# Patient Record
Sex: Female | Born: 1965 | Race: Black or African American | Hispanic: No | Marital: Single | Smoking: Current every day smoker
Health system: Southern US, Community
[De-identification: ages and names within clinical notes are randomized; demographics above are authoritative.]

## PROBLEM LIST (undated history)

## (undated) HISTORY — PX: CHOLECYSTECTOMY: SHX55

## (undated) HISTORY — PX: TUBAL LIGATION: SHX77

## (undated) HISTORY — PX: HERNIA REPAIR: SHX51

---

## 1997-12-31 ENCOUNTER — Ambulatory Visit (HOSPITAL_BASED_OUTPATIENT_CLINIC_OR_DEPARTMENT_OTHER): Admission: RE | Admit: 1997-12-31 | Discharge: 1997-12-31 | Payer: Self-pay | Admitting: General Surgery

## 2008-09-07 ENCOUNTER — Emergency Department (HOSPITAL_COMMUNITY): Admission: EM | Admit: 2008-09-07 | Discharge: 2008-09-07 | Payer: Self-pay | Admitting: Family Medicine

## 2010-09-10 LAB — POCT URINALYSIS DIP (DEVICE)
Bilirubin Urine: NEGATIVE
Glucose, UA: NEGATIVE mg/dL
Ketones, ur: NEGATIVE mg/dL
pH: 5.5 (ref 5.0–8.0)

## 2014-09-28 ENCOUNTER — Ambulatory Visit: Payer: BLUE CROSS/BLUE SHIELD | Attending: Gynecologic Oncology | Admitting: Gynecologic Oncology

## 2014-09-28 ENCOUNTER — Encounter: Payer: Self-pay | Admitting: Gynecologic Oncology

## 2014-09-28 VITALS — BP 138/92 | HR 104 | Temp 98.4°F | Resp 20 | Ht 61.0 in | Wt 235.6 lb

## 2014-09-28 DIAGNOSIS — N8320 Unspecified ovarian cysts: Secondary | ICD-10-CM

## 2014-09-28 DIAGNOSIS — N83201 Unspecified ovarian cyst, right side: Secondary | ICD-10-CM | POA: Insufficient documentation

## 2014-09-28 DIAGNOSIS — Z6841 Body Mass Index (BMI) 40.0 and over, adult: Secondary | ICD-10-CM | POA: Diagnosis not present

## 2014-09-28 NOTE — Progress Notes (Signed)
Consult Note: Gyn-Onc  Consult was requested by Dr. Radene Knee for the evaluation of Katherine Bean 49 y.o. female  CC:  Chief Complaint  Patient presents with  . Right ovarian benign tumor    Assessment/Plan:  Katherine Bean  is a 49 y.o.  year old morbidly obese woman with a right ovarian mass. This mass is most likely benign, an endometrioma, however given its increase in size (2 cm growth in 4 weeks) and a complex features on imaging, I am recommending surgical excision for definitive pathologic evaluation. I am reassured by the normal CA-125, her young age, and asymptomatic nature of this mass.I discussed the planned operative approach which is robotic-assisted right salpingo-oophorectomy, possible hysterectomy and staging if malignancy is identified. I discussed surgical risks including  bleeding, infection, damage to internal organs (such as bladder,ureters, bowels), blood clot, reoperation and rehospitalization. I discussed that she is at an increased risk for these given her morbid obesity. I offered her continued surveillance for an additional 6 weeks if she would prefer to avoid surgical intervention. Patient is anxious regarding the underlying pathology and would prefer for surgical excision.   HPI: Katherine Bean is a very pleasant 49 year old gravida 3 para 3 who is seen in consultation at the request of Dr. 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 The patient is history began in March 2016 at which time she saw her OB/GYN for routine annual care. She is a long-standing history of irregular menses, however had more recently noted an increased thousand on discharge at the end of her period. This prompted Dr. Radene Knee to  order a transvaginal ultrasound scan which was performed on March 30 second 2016. It showed a normal uterus with a 4 mm endometrial stripe, a normal left ovary, and a right ovary with a hemorrhagic 3.5 x 3.1 cm cystic mass with septations and  loculations and no flow seen to the cysts. The plan was to follow up ultrasound scan in 4 weeks' time and this was performed on 09/18/2014. At this time the right ovary now measured 5.5 x 3.2 x 3.8 cm, 1 CO2 multicystic complex mass with no blood flow seen within the mass. There was no free fluid within the cul-de-sac.A CA-125 was drawn on 09/18/2014 and was normal at 13 units per milliliter.  The patient reports no symptoms concerning for ovarian cancer including no bloating, early satiety, pelvic pain, change in bladder or bowel habit. She is otherwise fairly healthy with the exception of morbid obesity. Her only abdominal surgeries are laparoscopic cholecystectomy and tubal ligation. She is no concerning family history for a genetic predisposition for breast or ovarian cancer.  Current Meds:  Outpatient Encounter Prescriptions as of 09/28/2014  Medication Sig  . clotrimazole-betamethasone (LOTRISONE) cream   . metroNIDAZOLE (METROGEL) 0.75 % vaginal gel     Allergy: No Known Allergies  Social Hx:   History   Social History  . Marital Status: Single    Spouse Name: N/A  . Number of Children: N/A  . Years of Education: N/A   Occupational History  . Not on file.   Social History Main Topics  . Smoking status: Current Every Day Smoker    Types: Cigarettes  . Smokeless tobacco: Not on file  . Alcohol Use: 0.6 oz/week    1 Glasses of wine per week  . Drug Use: No  . Sexual Activity: Yes   Other Topics Concern  . Not on  file   Social History Narrative  . No narrative on file    Past Surgical Hx: History reviewed. No pertinent past surgical history.  Past Medical Hx: History reviewed. No pertinent past medical history.  Past Gynecological History:  SVD x 3, no hx of abnormal paps.  No LMP recorded.  Family Hx: History reviewed. No pertinent family history.  Review of Systems:  Constitutional  Feels well,    ENT Normal appearing ears and nares bilaterally Skin/Breast   No rash, sores, jaundice, itching, dryness Cardiovascular  No chest pain, shortness of breath, or edema  Pulmonary  No cough or wheeze.  Gastro Intestinal  No nausea, vomitting, or diarrhoea. No bright red blood per rectum, no abdominal pain, change in bowel movement, or constipation.  Genito Urinary  No frequency, urgency, dysuria, see HPI Musculo Skeletal  No myalgia, arthralgia, joint swelling or pain  Neurologic  No weakness, numbness, change in gait,  Psychology  No depression, anxiety, insomnia.   Vitals:  Blood pressure 138/92, pulse 104, temperature 98.4 F (36.9 C), temperature source Oral, resp. rate 20, height 5\' 1"  (1.549 m), weight 235 lb 9.6 oz (106.867 kg).  Physical Exam: WD in NAD Neck  Supple NROM, without any enlargements.  Lymph Node Survey No cervical supraclavicular or inguinal adenopathy Cardiovascular  Pulse normal rate, regularity and rhythm. S1 and S2 normal.  Lungs  Clear to auscultation bilateraly, without wheezes/crackles/rhonchi. Good air movement.  Skin  No rash/lesions/breakdown  Psychiatry  Alert and oriented to person, place, and time  Abdomen  Normoactive bowel sounds, abdomen soft, non-tender and obese without evidence of hernia.  Back No CVA tenderness Genito Urinary  Vulva/vagina: Normal external female genitalia.   No lesions. No discharge or bleeding.  Bladder/urethra:  No lesions or masses, well supported bladder  Vagina: no lesions  Cervix: Normal appearing, no lesions.  Uterus:  Small, mobile, no parametrial involvement or nodularity.  Adnexa: no palpable masses. Rectal  Good tone, no masses no cul de sac nodularity.  Extremities  No bilateral cyanosis, clubbing or edema.   Donaciano Eva, MD   09/28/2014, 1:33 PM

## 2014-09-28 NOTE — Patient Instructions (Signed)
Preparing for your Surgery  Plan for surgery on Jun 14 with Dr. Denman George  Pre-operative Testing -You will receive a phone call from presurgical testing at [facility] to arrange for a pre-operative testing appointment before your surgery.  This appointment normally occurs one to two weeks before your scheduled surgery.   -Bring your insurance card, copy of an advanced directive if applicable, medication list  -At that visit, you will be asked to sign a consent for a possible blood transfusion in case a transfusion becomes necessary during surgery.  The need for a blood transfusion is rare but having consent is a necessary part of your care.     -You should not be taking blood thinners or aspirin at least ten days prior to surgery unless instructed by your surgeon.  Day Before Surgery at [facility] will be asked to take in only clear liquids the day before surgery.  Examples of clear liquids include broths, jello, and clear juices.  You will be advised to have nothing to eat or drink after midnight the evening before.    Your role in recovery Your role is to become active as soon as directed by your doctor, while still giving yourself time to heal.  Rest when you feel tired. You will be asked to do the following in order to speed your recovery:  - Cough and breathe deeply. This helps toclear and expand your lungs and can prevent pneumonia. You may be given a spirometer to practice deep breathing. A staff member will show you how to use the spirometer. - Do mild physical activity. Walking or moving your legs help your circulation and body functions return to normal. A staff member will help you when you try to walk and will provide you with simple exercises. Do not try to get up or walk alone the first time. - Actively manage your pain. Managing your pain lets you move in comfort. We will ask you to rate your pain on a scale of zero to 10. It is your responsibility to tell your  doctor or nurse where and how much you hurt so your pain can be treated.  Special Considerations -If you are diabetic, you may be placed on insulin after surgery to have closer control over your blood sugars to promote healing and recovery.  This does not mean that you will be discharged on insulin.  If applicable, your oral antidiabetics will be resumed when you are tolerating a solid diet.  -Your final pathology results from surgery should be available by the Friday after surgery and the results will be relayed to you when available.                Preparing for your Surgery  Plan for surgery on Jun 14  with Dr. Dr. Denman George  Pre-operative Testing -You will receive a phone call from presurgical testing at [facility]  before your surgery.   -You should not be taking blood thinners or aspirin at least ten days prior to surgery unless instructed by your surgeon.  Day Before Surgery at [facility] will be asked to take in only clear liquids the day before surgery.  Examples of clear liquids include broths, jello, and clear juices.   You will be advised to have nothing to eat or drink after midnight the evening before.    Your role in recovery Your role is to become active as soon as directed by your doctor, while still giving yourself time to  heal.  Rest when you feel tired. You will be asked to do the following in order to speed your recovery:  - Cough and breathe deeply. This helps toclear and expand your lungs and can prevent pneumonia. You may be given a spirometer to practice deep breathing. A staff member will show you how to use the spirometer. - Do mild physical activity. Walking or moving your legs help your circulation and body functions return to normal. A staff member will help you when you try to walk and will provide you with simple exercises. Do not try to get up or walk alone the first time. - Actively manage your pain. Managing your pain lets you move in comfort. We will  ask you to rate your pain on a scale of zero to 10. It is your responsibility to tell your doctor or nurse where and how much you hurt so your pain can be treated.  Special Considerations -If you are diabetic, you may be placed on insulin after surgery to have closer control over your blood sugars to promote healing and recovery.  This does not mean that you will be discharged on insulin.  If applicable, your oral antidiabetics will be resumed when you are tolerating a solid diet.  -Your final pathology results from surgery should be available by the Friday after surgery and the results will be relayed to you when available.

## 2014-11-09 NOTE — Patient Instructions (Addendum)
Katherine Bean  11/09/2014   Your procedure is scheduled on:   11-13-2014 Tuesday  Enter through [facility]  Entrance and follow signs to [facility] Arrive at      0730 AM.  (Limit 1 person with you).  Call this number if you have problems the morning of surgery: [phone]  Or Presurgical Testing [phone].   For Living Will and/or Health Care Power Attorney Forms: please provide copy for your medical record,may bring AM of surgery(Forms should be already notarized -we do not provide this service).(11-12-14  No information preferred today).  Remember: Follow any bowel prep instructions per MD office.(Clear Liquids only day before surgery). For Cpap use: Bring mask and tubing only.   Do not eat food/ or drink: After Midnight.    Clear liquids include soda, tea, black coffee, apple or grape juice, broth.  Take these medicines the morning of surgery with A SIP OF WATER: NONE.   Do not wear jewelry, make-up or nail polish.  Do not wear deodorant, lotions, powders, or perfumes.   Do not shave legs and under arms- 48 hours(2 days) prior to first CHG shower.(Shaving face and neck okay.)  Do not bring valuables to the hospital.(Hospital is not responsible for lost valuables).  Contacts, dentures or removable bridgework, body piercing, hair pins may not be worn into surgery.  Leave suitcase in the car. After surgery it may be brought to your room.  For patients admitted to the hospital, checkout time is 11:00 AM the day of discharge.(Restricted visitors-Any Persons displaying flu-like symptoms or illness).    Patients discharged the day of surgery will not be allowed to drive home. Must have responsible person with you x 24 hours once discharged.  Name and phone number of your driver: Patrick North, daughter [phone] cell     Please read over the following fact sheets that you were given:  CHG(Chlorhexidine Gluconate 4% Surgical Soap) use,  Blood Transfusion fact sheet,  Incentive Spirometry Instruction.  Remember : Type/Screen "Blue armbands" - may not be removed once applied(would result in being retested AM of surgery, if removed).         [facility] - Preparing for Surgery Before surgery, you can play an important role.  Because skin is not sterile, your skin needs to be as free of germs as possible.  You can reduce the number of germs on your skin by washing with CHG (chlorahexidine gluconate) soap before surgery.  CHG is an antiseptic cleaner which kills germs and bonds with the skin to continue killing germs even after washing. Please DO NOT use if you have an allergy to CHG or antibacterial soaps.  If your skin becomes reddened/irritated stop using the CHG and inform your nurse when you arrive at Short Stay. Do not shave (including legs and underarms) for at least 48 hours prior to the first CHG shower.  You may shave your face/neck. Please follow these instructions carefully:  1.  Shower with CHG Soap the night before surgery and the  morning of Surgery.  2.  If you choose to wash your hair, wash your hair first as usual with your  normal  shampoo.  3.  After you shampoo, rinse your hair and body thoroughly to remove the  shampoo.                           4.  Use CHG as you would any other liquid  soap.  You can apply chg directly  to the skin and wash                       Gently with a scrungie or clean washcloth.  5.  Apply the CHG Soap to your body ONLY FROM THE NECK DOWN.   Do not use on face/ open                           Wound or open sores. Avoid contact with eyes, ears mouth and genitals (private parts).                       Wash face,  Genitals (private parts) with your normal soap.             6.  Wash thoroughly, paying special attention to the area where your surgery  will be performed.  7.  Thoroughly rinse your body with warm water from the neck down.  8.  DO NOT shower/wash with your normal soap after using and rinsing off  the CHG  Soap.                9.  Pat yourself dry with a clean towel.            10.  Wear clean pajamas.            11.  Place clean sheets on your bed the night of your first shower and do not  sleep with pets. Day of Surgery : Do not apply any lotions/deodorants the morning of surgery.  Please wear clean clothes to the hospital/surgery center.  FAILURE TO FOLLOW THESE INSTRUCTIONS MAY RESULT IN THE CANCELLATION OF YOUR SURGERY PATIENT SIGNATURE_________________________________  NURSE SIGNATURE__________________________________  ________________________________________________________________________   [name]  An incentive spirometer is a tool that can help keep your lungs clear and active. This tool measures how well you are filling your lungs with each breath. Taking long deep breaths may help reverse or decrease the chance of developing breathing (pulmonary) problems (especially infection) following:  A long period of time when you are unable to move or be active. BEFORE THE PROCEDURE   If the spirometer includes an indicator to show your best effort, your nurse or respiratory therapist will set it to a desired goal.  If possible, sit up straight or lean slightly forward. Try not to slouch.  Hold the incentive spirometer in an upright position. INSTRUCTIONS FOR USE   Sit on the edge of your bed if possible, or sit up as far as you can in bed or on a chair.  Hold the incentive spirometer in an upright position.  Breathe out normally.  Place the mouthpiece in your mouth and seal your lips tightly around it.  Breathe in slowly and as deeply as possible, raising the piston or the ball toward the top of the column.  Hold your breath for 3-5 seconds or for as long as possible. Allow the piston or ball to fall to the bottom of the column.  Remove the mouthpiece from your mouth and breathe out normally.  Rest for a few seconds and repeat Steps 1 through 7 at least 10  times every 1-2 hours when you are awake. Take your time and take a few normal breaths between deep breaths.  The spirometer may include an indicator to show your best effort. Use the indicator as a goal  to work toward during each repetition.  After each set of 10 deep breaths, practice coughing to be sure your lungs are clear. If you have an incision (the cut made at the time of surgery), support your incision when coughing by placing a pillow or rolled up towels firmly against it. Once you are able to get out of bed, walk around indoors and cough well. You may stop using the incentive spirometer when instructed by your caregiver.  RISKS AND COMPLICATIONS  Take your time so you do not get dizzy or light-headed.  If you are in pain, you may need to take or ask for pain medication before doing incentive spirometry. It is harder to take a deep breath if you are having pain. AFTER USE  Rest and breathe slowly and easily.  It can be helpful to keep track of a log of your progress. Your caregiver can provide you with a simple table to help with this. If you are using the spirometer at home, follow these instructions: [place] IF:   You are having difficultly using the spirometer.  You have trouble using the spirometer as often as instructed.  Your pain medication is not giving enough relief while using the spirometer.  You develop fever of 100.5 F (38.1 C) or higher. SEEK IMMEDIATE MEDICAL CARE IF:   You cough up bloody sputum that had not been present before.  You develop fever of 102 F (38.9 C) or greater.  You develop worsening pain at or near the incision site. MAKE SURE YOU:   Understand these instructions.  Will watch your condition.  Will get help right away if you are not doing well or get worse. Document Released: 09/28/2006 Document Revised: 08/10/2011 Document Reviewed: 11/29/2006 ExitCare Patient Information 2014 Exit[employer],  [place].   ________________________________________________________________________  WHAT IS A BLOOD TRANSFUSION? Blood Transfusion Information  A transfusion is the replacement of blood or some of its parts. Blood is made up of multiple cells which provide different functions.  Red blood cells carry oxygen and are used for blood loss replacement.  White blood cells fight against infection.  Platelets control bleeding.  Plasma helps clot blood.  Other blood products are available for specialized needs, such as hemophilia or other clotting disorders. BEFORE THE TRANSFUSION  Who gives blood for transfusions?   Healthy volunteers who are fully evaluated to make sure their blood is safe. This is blood bank blood. Transfusion therapy is the safest it has ever been in the practice of medicine. Before blood is taken from a donor, a complete history is taken to make sure that person has no history of diseases nor engages in risky social behavior (examples are intravenous drug use or sexual activity with multiple partners). The donor's travel history is screened to minimize risk of transmitting infections, such as malaria. The donated blood is tested for signs of infectious diseases, such as HIV and hepatitis. The blood is then tested to be sure it is compatible with you in order to minimize the chance of a transfusion reaction. If you or a relative donates blood, this is often done in anticipation of surgery and is not appropriate for emergency situations. It takes many days to process the donated blood. RISKS AND COMPLICATIONS Although transfusion therapy is very safe and saves many lives, the main dangers of transfusion include:   Getting an infectious disease.  Developing a transfusion reaction. This is an allergic reaction to something in the blood you were given. Every precaution is  taken to prevent this. The decision to have a blood transfusion has been considered carefully by your caregiver  before blood is given. Blood is not given unless the benefits outweigh the risks. AFTER THE TRANSFUSION  Right after receiving a blood transfusion, you will usually feel much better and more energetic. This is especially true if your red blood cells have gotten low (anemic). The transfusion raises the level of the red blood cells which carry oxygen, and this usually causes an energy increase.  The nurse administering the transfusion will monitor you carefully for complications. HOME CARE INSTRUCTIONS  No special instructions are needed after a transfusion. You may find your energy is better. Speak with your caregiver about any limitations on activity for underlying diseases you may have. SEEK MEDICAL CARE IF:   Your condition is not improving after your transfusion.  You develop redness or irritation at the intravenous (IV) site. SEEK IMMEDIATE MEDICAL CARE IF:  Any of the following symptoms occur over the next 12 hours:  Shaking chills.  You have a temperature by mouth above 102 F (38.9 C), not controlled by medicine.  Chest, back, or muscle pain.  People around you feel you are not acting correctly or are confused.  Shortness of breath or difficulty breathing.  Dizziness and fainting.  You get a rash or develop hives.  You have a decrease in urine output.  Your urine turns a dark color or changes to pink, red, or brown. Any of the following symptoms occur over the next 10 days:  You have a temperature by mouth above 102 F (38.9 C), not controlled by medicine.  Shortness of breath.  Weakness after normal activity.  The white part of the eye turns yellow (jaundice).  You have a decrease in the amount of urine or are urinating less often.  Your urine turns a dark color or changes to pink, red, or brown. Document Released: 05/15/2000 Document Revised: 08/10/2011 Document Reviewed: 01/02/2008 [facility] Patient Information 2014 [place],  [place].  _______________________________________________________________________

## 2014-11-12 ENCOUNTER — Telehealth: Payer: Self-pay | Admitting: *Deleted

## 2014-11-12 ENCOUNTER — Encounter (HOSPITAL_COMMUNITY)
Admission: RE | Admit: 2014-11-12 | Discharge: 2014-11-12 | Disposition: A | Discharge status: Home / Self Care | Payer: BLUE CROSS/BLUE SHIELD | Source: Ambulatory Visit | Attending: Gynecologic Oncology | Admitting: Gynecologic Oncology

## 2014-11-12 ENCOUNTER — Encounter (HOSPITAL_COMMUNITY): Payer: Self-pay

## 2014-11-12 DIAGNOSIS — Z6841 Body Mass Index (BMI) 40.0 and over, adult: Secondary | ICD-10-CM | POA: Diagnosis not present

## 2014-11-12 DIAGNOSIS — N736 Female pelvic peritoneal adhesions (postinfective): Secondary | ICD-10-CM | POA: Diagnosis not present

## 2014-11-12 DIAGNOSIS — N838 Other noninflammatory disorders of ovary, fallopian tube and broad ligament: Secondary | ICD-10-CM | POA: Diagnosis present

## 2014-11-12 DIAGNOSIS — Z79899 Other long term (current) drug therapy: Secondary | ICD-10-CM | POA: Diagnosis not present

## 2014-11-12 DIAGNOSIS — Z792 Long term (current) use of antibiotics: Secondary | ICD-10-CM | POA: Diagnosis not present

## 2014-11-12 DIAGNOSIS — F17210 Nicotine dependence, cigarettes, uncomplicated: Secondary | ICD-10-CM | POA: Diagnosis not present

## 2014-11-12 DIAGNOSIS — D270 Benign neoplasm of right ovary: Secondary | ICD-10-CM | POA: Diagnosis not present

## 2014-11-12 LAB — CBC WITH DIFFERENTIAL/PLATELET
Basophils Absolute: 0 10*3/uL (ref 0.0–0.1)
Basophils Relative: 0 % (ref 0–1)
EOS PCT: 3 % (ref 0–5)
Eosinophils Absolute: 0.2 10*3/uL (ref 0.0–0.7)
HEMATOCRIT: 39.2 % (ref 36.0–46.0)
HEMOGLOBIN: 12.3 g/dL (ref 12.0–15.0)
LYMPHS ABS: 3 10*3/uL (ref 0.7–4.0)
LYMPHS PCT: 33 % (ref 12–46)
MCH: 23.7 pg — ABNORMAL LOW (ref 26.0–34.0)
MCHC: 31.4 g/dL (ref 30.0–36.0)
MCV: 75.4 fL — AB (ref 78.0–100.0)
Monocytes Absolute: 0.8 10*3/uL (ref 0.1–1.0)
Monocytes Relative: 9 % (ref 3–12)
Neutro Abs: 5 10*3/uL (ref 1.7–7.7)
Neutrophils Relative %: 55 % (ref 43–77)
PLATELETS: 429 10*3/uL — AB (ref 150–400)
RBC: 5.2 MIL/uL — ABNORMAL HIGH (ref 3.87–5.11)
RDW: 15 % (ref 11.5–15.5)
WBC: 9 10*3/uL (ref 4.0–10.5)

## 2014-11-12 LAB — URINALYSIS, ROUTINE W REFLEX MICROSCOPIC
BILIRUBIN URINE: NEGATIVE
Glucose, UA: NEGATIVE mg/dL
Ketones, ur: NEGATIVE mg/dL
NITRITE: NEGATIVE
PH: 5.5 (ref 5.0–8.0)
Protein, ur: NEGATIVE mg/dL
SPECIFIC GRAVITY, URINE: 1.029 (ref 1.005–1.030)
UROBILINOGEN UA: 1 mg/dL (ref 0.0–1.0)

## 2014-11-12 LAB — COMPREHENSIVE METABOLIC PANEL
ALT: 14 U/L (ref 14–54)
ANION GAP: 8 (ref 5–15)
AST: 18 U/L (ref 15–41)
Albumin: 3.9 g/dL (ref 3.5–5.0)
Alkaline Phosphatase: 58 U/L (ref 38–126)
BILIRUBIN TOTAL: 0.3 mg/dL (ref 0.3–1.2)
BUN: 11 mg/dL (ref 6–20)
CHLORIDE: 104 mmol/L (ref 101–111)
CO2: 26 mmol/L (ref 22–32)
Calcium: 9 mg/dL (ref 8.9–10.3)
Creatinine, Ser: 0.78 mg/dL (ref 0.44–1.00)
GFR calc Af Amer: >60 mL/min (ref >60)
GFR calc non Af Amer: >60 mL/min (ref >60)
GLUCOSE: 100 mg/dL — AB (ref 65–99)
Potassium: 4 mmol/L (ref 3.5–5.1)
SODIUM: 138 mmol/L (ref 135–145)
Total Protein: 7.6 g/dL (ref 6.5–8.1)

## 2014-11-12 LAB — TYPE AND SCREEN
ABO/RH(D): AB POS
Antibody Screen: NEGATIVE

## 2014-11-12 LAB — URINE MICROSCOPIC-ADD ON

## 2014-11-12 LAB — PREGNANCY, URINE: PREG TEST UR: NEGATIVE

## 2014-11-12 LAB — ABO/RH: ABO/RH(D): AB POS

## 2014-11-12 NOTE — Progress Notes (Signed)
11-12-14 1120 Labs viewable in Epic-note urinalysis. Note faxed [phone].

## 2014-11-12 NOTE — Telephone Encounter (Signed)
Received call from [facility] at presurgical testing at [facility] inquiring if Dr. Denman George would like to add on a urine culture for patient. Per Dr. Denman George, she does not want a urine culture at this time.

## 2014-11-12 NOTE — Pre-Procedure Instructions (Addendum)
11-12-14 1120 Urinalysis faxed to [phone] Dr. Serita Grit office. [phone] Reported to "Maudie Mercury" of Dr. Serita Grit office.

## 2014-11-13 ENCOUNTER — Ambulatory Visit (HOSPITAL_COMMUNITY)
Admission: RE | Admit: 2014-11-13 | Discharge: 2014-11-13 | Disposition: A | Discharge status: Home / Self Care | Payer: BLUE CROSS/BLUE SHIELD | Source: Ambulatory Visit | Attending: Gynecologic Oncology | Admitting: Gynecologic Oncology

## 2014-11-13 ENCOUNTER — Encounter (HOSPITAL_COMMUNITY): Payer: Self-pay | Admitting: *Deleted

## 2014-11-13 ENCOUNTER — Encounter (HOSPITAL_COMMUNITY)
Admission: RE | Disposition: A | Discharge status: Home / Self Care | Payer: Self-pay | Source: Ambulatory Visit | Attending: Gynecologic Oncology

## 2014-11-13 ENCOUNTER — Ambulatory Visit (HOSPITAL_COMMUNITY): Payer: BLUE CROSS/BLUE SHIELD | Admitting: Anesthesiology

## 2014-11-13 DIAGNOSIS — D270 Benign neoplasm of right ovary: Secondary | ICD-10-CM | POA: Diagnosis not present

## 2014-11-13 DIAGNOSIS — N83201 Unspecified ovarian cyst, right side: Secondary | ICD-10-CM | POA: Diagnosis present

## 2014-11-13 DIAGNOSIS — Z6841 Body Mass Index (BMI) 40.0 and over, adult: Secondary | ICD-10-CM | POA: Insufficient documentation

## 2014-11-13 DIAGNOSIS — Z79899 Other long term (current) drug therapy: Secondary | ICD-10-CM | POA: Insufficient documentation

## 2014-11-13 DIAGNOSIS — F17210 Nicotine dependence, cigarettes, uncomplicated: Secondary | ICD-10-CM | POA: Insufficient documentation

## 2014-11-13 DIAGNOSIS — Z792 Long term (current) use of antibiotics: Secondary | ICD-10-CM | POA: Insufficient documentation

## 2014-11-13 DIAGNOSIS — N736 Female pelvic peritoneal adhesions (postinfective): Secondary | ICD-10-CM | POA: Insufficient documentation

## 2014-11-13 HISTORY — PX: ROBOTIC ASSISTED SALPINGO OOPHERECTOMY: SHX6082

## 2014-11-13 SURGERY — ROBOTIC ASSISTED SALPINGO OOPHORECTOMY
Anesthesia: General | Laterality: Right

## 2014-11-13 MED ORDER — DEXAMETHASONE SODIUM PHOSPHATE 10 MG/ML IJ SOLN
INTRAMUSCULAR | Status: AC
  Filled 2014-11-13: qty 1

## 2014-11-13 MED ORDER — PROMETHAZINE HCL 25 MG/ML IJ SOLN: 6.2500 mg | INTRAMUSCULAR | Status: DC | PRN

## 2014-11-13 MED ORDER — STERILE WATER FOR IRRIGATION IR SOLN
Status: DC | PRN
  Administered 2014-11-13: 1500 mL

## 2014-11-13 MED ORDER — HYDROMORPHONE HCL 2 MG/ML IJ SOLN
INTRAMUSCULAR | Status: AC
  Filled 2014-11-13: qty 1

## 2014-11-13 MED ORDER — KETOROLAC TROMETHAMINE 30 MG/ML IJ SOLN
INTRAMUSCULAR | Status: AC
  Filled 2014-11-13: qty 1

## 2014-11-13 MED ORDER — HYDROMORPHONE HCL 1 MG/ML IJ SOLN
INTRAMUSCULAR | Status: DC | PRN
Start: 2014-11-13 — End: 2014-11-13
  Administered 2014-11-13: .4 mg via INTRAVENOUS
  Administered 2014-11-13 (×3): .2 mg via INTRAVENOUS

## 2014-11-13 MED ORDER — FENTANYL CITRATE (PF) 100 MCG/2ML IJ SOLN
25.0000 ug | INTRAMUSCULAR | Status: DC | PRN
  Administered 2014-11-13 (×3): 50 ug via INTRAVENOUS

## 2014-11-13 MED ORDER — NEOSTIGMINE METHYLSULFATE 10 MG/10ML IV SOLN
INTRAVENOUS | Status: AC
  Filled 2014-11-13: qty 1

## 2014-11-13 MED ORDER — MIDAZOLAM HCL 5 MG/5ML IJ SOLN
INTRAMUSCULAR | Status: DC | PRN
  Administered 2014-11-13: 2 mg via INTRAVENOUS

## 2014-11-13 MED ORDER — PROPOFOL 10 MG/ML IV BOLUS
INTRAVENOUS | Status: DC | PRN
  Administered 2014-11-13: 200 mg via INTRAVENOUS

## 2014-11-13 MED ORDER — GLYCOPYRROLATE 0.2 MG/ML IJ SOLN
INTRAMUSCULAR | Status: AC
  Filled 2014-11-13: qty 3

## 2014-11-13 MED ORDER — CEFAZOLIN SODIUM-DEXTROSE 2-3 GM-% IV SOLR
INTRAVENOUS | Status: AC
  Filled 2014-11-13: qty 50

## 2014-11-13 MED ORDER — LACTATED RINGERS IV SOLN: INTRAVENOUS | Status: DC

## 2014-11-13 MED ORDER — MEPERIDINE HCL 50 MG/ML IJ SOLN: 6.2500 mg | INTRAMUSCULAR | Status: DC | PRN

## 2014-11-13 MED ORDER — ACETAMINOPHEN 325 MG PO TABS: 650.0000 mg | ORAL_TABLET | ORAL | Status: DC | PRN

## 2014-11-13 MED ORDER — MIDAZOLAM HCL 2 MG/2ML IJ SOLN
INTRAMUSCULAR | Status: AC
  Filled 2014-11-13: qty 2

## 2014-11-13 MED ORDER — HYDROMORPHONE HCL 2 MG PO TABS: 2.0000 mg | ORAL_TABLET | ORAL | Status: DC | PRN

## 2014-11-13 MED ORDER — ONDANSETRON HCL 4 MG/2ML IJ SOLN
INTRAMUSCULAR | Status: AC
  Filled 2014-11-13: qty 2

## 2014-11-13 MED ORDER — LACTATED RINGERS IV SOLN
INTRAVENOUS | Status: DC | PRN
  Administered 2014-11-13: 1000 mL

## 2014-11-13 MED ORDER — ACETAMINOPHEN 650 MG RE SUPP
650.0000 mg | RECTAL | Status: DC | PRN
  Filled 2014-11-13: qty 1

## 2014-11-13 MED ORDER — ROCURONIUM BROMIDE 100 MG/10ML IV SOLN
INTRAVENOUS | Status: DC | PRN
  Administered 2014-11-13: 50 mg via INTRAVENOUS

## 2014-11-13 MED ORDER — SODIUM CHLORIDE 0.9 % IV SOLN: 250.0000 mL | INTRAVENOUS | Status: DC | PRN

## 2014-11-13 MED ORDER — GLYCOPYRROLATE 0.2 MG/ML IJ SOLN
INTRAMUSCULAR | Status: DC | PRN
  Administered 2014-11-13: 0.6 mg via INTRAVENOUS

## 2014-11-13 MED ORDER — SODIUM CHLORIDE 0.9 % IJ SOLN: 3.0000 mL | Freq: Two times a day (BID) | INTRAMUSCULAR | Status: DC

## 2014-11-13 MED ORDER — DEXAMETHASONE SODIUM PHOSPHATE 10 MG/ML IJ SOLN
INTRAMUSCULAR | Status: DC | PRN
  Administered 2014-11-13: 10 mg via INTRAVENOUS

## 2014-11-13 MED ORDER — KETOROLAC TROMETHAMINE 30 MG/ML IJ SOLN
30.0000 mg | Freq: Once | INTRAMUSCULAR | Status: AC
  Administered 2014-11-13: 30 mg via INTRAVENOUS

## 2014-11-13 MED ORDER — ONDANSETRON HCL 4 MG/2ML IJ SOLN
INTRAMUSCULAR | Status: DC | PRN
  Administered 2014-11-13: 4 mg via INTRAVENOUS

## 2014-11-13 MED ORDER — ARTIFICIAL TEARS OP OINT
TOPICAL_OINTMENT | OPHTHALMIC | Status: AC
  Filled 2014-11-13: qty 3.5

## 2014-11-13 MED ORDER — FENTANYL CITRATE (PF) 250 MCG/5ML IJ SOLN
INTRAMUSCULAR | Status: AC
  Filled 2014-11-13: qty 5

## 2014-11-13 MED ORDER — HYDROMORPHONE HCL 2 MG PO TABS: 2.0000 mg | ORAL_TABLET | Freq: Four times a day (QID) | ORAL | Status: AC | PRN

## 2014-11-13 MED ORDER — FENTANYL CITRATE (PF) 100 MCG/2ML IJ SOLN
INTRAMUSCULAR | Status: AC
  Filled 2014-11-13: qty 2

## 2014-11-13 MED ORDER — PROPOFOL 10 MG/ML IV BOLUS
INTRAVENOUS | Status: AC
  Filled 2014-11-13: qty 20

## 2014-11-13 MED ORDER — CEFAZOLIN SODIUM-DEXTROSE 2-3 GM-% IV SOLR
2.0000 g | INTRAVENOUS | Status: AC
  Administered 2014-11-13: 2 g via INTRAVENOUS

## 2014-11-13 MED ORDER — NEOSTIGMINE METHYLSULFATE 10 MG/10ML IV SOLN
INTRAVENOUS | Status: DC | PRN
  Administered 2014-11-13: 4 mg via INTRAVENOUS

## 2014-11-13 MED ORDER — FENTANYL CITRATE (PF) 250 MCG/5ML IJ SOLN
INTRAMUSCULAR | Status: DC | PRN
  Administered 2014-11-13 (×2): 100 ug via INTRAVENOUS

## 2014-11-13 MED ORDER — LIDOCAINE HCL (CARDIAC) 20 MG/ML IV SOLN
INTRAVENOUS | Status: AC
  Filled 2014-11-13: qty 5

## 2014-11-13 MED ORDER — SODIUM CHLORIDE 0.9 % IJ SOLN: 3.0000 mL | INTRAMUSCULAR | Status: DC | PRN

## 2014-11-13 MED ORDER — ROCURONIUM BROMIDE 100 MG/10ML IV SOLN
INTRAVENOUS | Status: AC
  Filled 2014-11-13: qty 1

## 2014-11-13 MED ORDER — LACTATED RINGERS IV SOLN
INTRAVENOUS | Status: DC
  Administered 2014-11-13: 14:00:00 via INTRAVENOUS
  Administered 2014-11-13: 1000 mL via INTRAVENOUS

## 2014-11-13 SURGICAL SUPPLY — 45 items
CHLORAPREP W/TINT 26ML (MISCELLANEOUS) ×2 IMPLANT
COVER SURGICAL LIGHT HANDLE (MISCELLANEOUS) ×2 IMPLANT
COVER TIP SHEARS 8 DVNC (MISCELLANEOUS) ×1 IMPLANT
COVER TIP SHEARS 8MM DA VINCI (MISCELLANEOUS) ×1
DRAPE ARM DVNC X/XI (DISPOSABLE) ×4 IMPLANT
DRAPE COLUMN DVNC XI (DISPOSABLE) ×1 IMPLANT
DRAPE DA VINCI XI ARM (DISPOSABLE) ×4
DRAPE DA VINCI XI COLUMN (DISPOSABLE) ×1
DRAPE SHEET LG 3/4 BI-LAMINATE (DRAPES) ×4 IMPLANT
DRAPE SURG IRRIG POUCH 19X23 (DRAPES) ×2 IMPLANT
DRAPE TABLE BACK 44X90 PK DISP (DRAPES) ×4 IMPLANT
DRAPE WARM FLUID 44X44 (DRAPE) ×2 IMPLANT
ELECT REM PT RETURN 9FT ADLT (ELECTROSURGICAL) ×2
ELECTRODE REM PT RTRN 9FT ADLT (ELECTROSURGICAL) ×1 IMPLANT
GLOVE BIO SURGEON STRL SZ 6 (GLOVE) ×6 IMPLANT
GLOVE BIO SURGEON STRL SZ 6.5 (GLOVE) ×4 IMPLANT
GOWN STRL REUS W/ TWL LRG LVL3 (GOWN DISPOSABLE) ×2 IMPLANT
GOWN STRL REUS W/TWL LRG LVL3 (GOWN DISPOSABLE) ×2
HOLDER FOLEY CATH W/STRAP (MISCELLANEOUS) ×2 IMPLANT
KIT ACCESSORY DA VINCI DISP (KITS)
KIT ACCESSORY DVNC DISP (KITS) IMPLANT
KIT BASIN OR (CUSTOM PROCEDURE TRAY) ×2 IMPLANT
LIQUID BAND (GAUZE/BANDAGES/DRESSINGS) ×2 IMPLANT
MANIPULATOR UTERINE 4.5 ZUMI (MISCELLANEOUS) ×2 IMPLANT
OCCLUDER COLPOPNEUMO (BALLOONS) ×2 IMPLANT
PEN SKIN MARKING BROAD (MISCELLANEOUS) ×2 IMPLANT
POUCH SPECIMEN RETRIEVAL 10MM (ENDOMECHANICALS) IMPLANT
SEAL CANN UNIV 5-8 DVNC XI (MISCELLANEOUS) ×4 IMPLANT
SEAL XI 5MM-8MM UNIVERSAL (MISCELLANEOUS) ×4
SET TUBE IRRIG SUCTION NO TIP (IRRIGATION / IRRIGATOR) ×2 IMPLANT
SHEET LAVH (DRAPES) ×2 IMPLANT
SOLUTION ELECTROLUBE (MISCELLANEOUS) ×2 IMPLANT
SUT VIC AB 0 CT1 27 (SUTURE) ×1
SUT VIC AB 0 CT1 27XBRD ANTBC (SUTURE) ×1 IMPLANT
SUT VIC AB 4-0 PS2 27 (SUTURE) ×4 IMPLANT
SUT VICRYL 0 UR6 27IN ABS (SUTURE) ×2 IMPLANT
SYR 50ML LL SCALE MARK (SYRINGE) ×2 IMPLANT
TOWEL OR 17X26 10 PK STRL BLUE (TOWEL DISPOSABLE) ×4 IMPLANT
TOWEL OR NON WOVEN STRL DISP B (DISPOSABLE) ×2 IMPLANT
TRAP SPECIMEN MUCOUS 40CC (MISCELLANEOUS) IMPLANT
TRAY FOLEY W/METER SILVER 14FR (SET/KITS/TRAYS/PACK) ×2 IMPLANT
TRAY LAPAROSCOPIC (CUSTOM PROCEDURE TRAY) ×2 IMPLANT
TROCAR BLADELESS OPT 5 100 (ENDOMECHANICALS) ×2 IMPLANT
TROCAR XCEL 12X100 BLDLESS (ENDOMECHANICALS) ×2 IMPLANT
WATER STERILE IRR 1500ML POUR (IV SOLUTION) ×4 IMPLANT

## 2014-11-13 NOTE — Op Note (Signed)
OPERATIVE NOTE  Date: 11/13/14  Preoperative Diagnosis: right ovarian mass   Postoperative Diagnosis:  Mucinous right ovarian neoplasm benign appearing  Procedure(s) Performed: Robotic-assisted laparoscopic right salpingo-oophorectomy, lysis of adhesions  Surgeon: Everitt Amber, M.D.  Assistant Surgeon: Lahoma Crocker M.D. (an MD assistant was necessary for tissue manipulation, management of robotic instrumentation, retraction and positioning due to the complexity of the case and hospital policies).   Anesthesia: Gen. endotracheal.  Specimens: right ovary, fallopian tube, pelvic washings  Estimated Blood Loss: 20 mL. Blood Replacement: None  Complications: none  Indication for Procedure:  Right ovarian mass, complex, enlarging on ultrasound  Operative Findings: 6cm solid and cystic mass on right ovary, normal left tube and ovary, normal uterus. Adhesions to anterior abdominal wall / mesh with omentum. 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 She is placed in a dorsolithotomy position and cervical acromial pad was placed. The arms were tucked with care taken to pad the olecranon process. And prepped and draped in usual sterile fashion. A uterine manipulator (zumi) was placed vaginally. A 53mm incision was made in the left upper quadrant palmer's point and a 5 mm Optiview trocar used to enter the abdomen under direct visualization. With entry into the abdomen and then maintenance of 15 mm of mercury the patient was placed in Trendelenburg position. An incision was made in the umbilicus and an 58mm trochar was placed through this site. Two incisions were made lateral to the umbilical incision in the left and right abdomen measuring 30mm. These incisions were made approximately 10 cm  lateral to the umbilical incision. 8 mm robotic trochars were inserted.  Using sharp dissection with laparoscopic scissors the dense adhesions between the anterior abdominal wall and the omentum were taken down. Small bursts of monopolar energy were used in a controlled fashion to seal vessels. 20 minutes was spent taking down abdominal wall adhesions.  When adequate adhesiolysis was performed, the robot was docked.  The abdomen was inspected as was the pelvis.  Pelvic washings were obtained. An incision was made on the right pelvic side wall peritoneum parallel to the IP ligament and the retroperitoneal space entered. The right ureter was identified and the para-rectal space was developed. A window was created in the right broad ligament above the ureter. The right infundibulopelvic vessels were skeletonized cauterized and transected. The utero-ovarian ligaments similarly were cauterized and transected. Specimen was placed in an Endo Catch bag.  The specimen was retrieved through the left upper quadrant incision. It needed to be extended to 4cm to allow for delivery of the specimen because the mass was somewhat solid. The mass was morcellated within the bag, however the bag tore during specimen delivery and a single piece of ovarian tissue passed back into the abdomen. The robotic scope was used through the ports to place a new endocatch bag through the incision and retrieve the last piece of ovarian tissue. The peritoneal space was closely inspected and there was no residual ovarian tissue or bag fragments identified.  The robot was undocked.   The ports were all removed. The fascial closure at the left upper quadrant port was made with 0 Vicryl in a running suture.  All incisions were closed with a running subcuticular Monocryl suture. Dermabond was applied. Sponge, lap and needle counts were correct x 3.    The patient had  sequential compression devices for VTE prophylaxis.         Disposition:  PACU          Condition: stable  Donaciano Eva, MD

## 2014-11-13 NOTE — H&P (Signed)
Preop H&P Note: Gyn-Onc  Consult was requested by Dr. Radene Knee for the evaluation of Katherine Bean 49 y.o. female  CC:  Chief Complaint  Patient presents with  . Right ovarian benign tumor    Assessment/Plan:  Katherine Bean is a 49 y.o. year old morbidly obese woman with a right ovarian mass. This mass is most likely benign, an endometrioma, however given its increase in size (2 cm growth in 4 weeks) and a complex features on imaging, I am recommending surgical excision for definitive pathologic evaluation. I am reassured by the normal CA-125, her young age, and asymptomatic nature of this mass. I discussed the planned operative approach which is robotic-assisted right salpingo-oophorectomy, possible hysterectomy and staging if malignancy is identified. I discussed surgical risks including bleeding, infection, damage to internal organs (such as bladder,ureters, bowels), blood clot, reoperation and rehospitalization. I discussed that she is at an increased risk for these given her morbid obesity. I offered her continued surveillance for an additional 6 weeks if she would prefer to avoid surgical intervention. Patient is anxious regarding the underlying pathology and would prefer for surgical excision.   HPI: Katherine Bean is a very pleasant 49 year old gravida 3 para 3 who is seen in consultation at the request of Dr. 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 The patient is history began in March 2016 at which time she saw her OB/GYN for routine annual care. She is a long-standing history of irregular menses, however had more recently noted an increased thousand on discharge at the end of her period. This prompted Dr. Radene Knee to order a transvaginal ultrasound scan which was performed on March 30 second 2016. It showed a normal uterus with a 4 mm endometrial stripe, a normal left ovary, and a right ovary with a hemorrhagic 3.5 x 3.1 cm cystic mass with septations and  loculations and no flow seen to the cysts. The plan was to follow up ultrasound scan in 4 weeks' time and this was performed on 09/18/2014. At this time the right ovary now measured 5.5 x 3.2 x 3.8 cm, 1 CO2 multicystic complex mass with no blood flow seen within the mass. There was no free fluid within the cul-de-sac. A CA-125 was drawn on 09/18/2014 and was normal at 13 units per milliliter.   The patient reports no symptoms concerning for ovarian cancer including no bloating, early satiety, pelvic pain, change in bladder or bowel habit. She is otherwise fairly healthy with the exception of morbid obesity. Her only abdominal surgeries are laparoscopic cholecystectomy and tubal ligation. She is no concerning family history for a genetic predisposition for breast or ovarian cancer.  Current Meds:  Outpatient Encounter Prescriptions as of 09/28/2014  Medication Sig  . clotrimazole-betamethasone (LOTRISONE) cream   . metroNIDAZOLE (METROGEL) 0.75 % vaginal gel     Allergy: No Known Allergies  Social Hx:  History   Social History  . Marital Status: Single    Spouse Name: N/A  . Number of Children: N/A  . Years of Education: N/A   Occupational History  . Not on file.   Social History Main Topics  . Smoking status: Current Every Day Smoker    Types: Cigarettes  . Smokeless tobacco: Not on file  . Alcohol Use: 0.6 oz/week    1 Glasses of wine per week  . Drug Use: No  . Sexual Activity: Yes   Other Topics Concern  . Not on file  Social History Narrative  . No narrative on file    Past Surgical Hx: History reviewed. No pertinent past surgical history.  Past Medical Hx: History reviewed. No pertinent past medical history.  Past Gynecological History: SVD x 3, no hx of abnormal paps. No LMP recorded.  Family Hx: History reviewed. No pertinent family history.  Review of Systems:  Constitutional  Feels  well,  ENT Normal appearing ears and nares bilaterally Skin/Breast  No rash, sores, jaundice, itching, dryness Cardiovascular  No chest pain, shortness of breath, or edema  Pulmonary  No cough or wheeze.  Gastro Intestinal  No nausea, vomitting, or diarrhoea. No bright red blood per rectum, no abdominal pain, change in bowel movement, or constipation.  Genito Urinary  No frequency, urgency, dysuria, see HPI Musculo Skeletal  No myalgia, arthralgia, joint swelling or pain  Neurologic  No weakness, numbness, change in gait,  Psychology  No depression, anxiety, insomnia.   Vitals: Blood pressure 138/92, pulse 104, temperature 98.4 F (36.9 C), temperature source Oral, resp. rate 20, height 5\' 1"  (1.549 m), weight 235 lb 9.6 oz (106.867 kg).  Physical Exam: WD in NAD Neck  Supple NROM, without any enlargements.  Lymph Node Survey No cervical supraclavicular or inguinal adenopathy Cardiovascular  Pulse normal rate, regularity and rhythm. S1 and S2 normal.  Lungs  Clear to auscultation bilateraly, without wheezes/crackles/rhonchi. Good air movement.  Skin  No rash/lesions/breakdown  Psychiatry  Alert and oriented to person, place, and time  Abdomen  Normoactive bowel sounds, abdomen soft, non-tender and obese without evidence of hernia.  Back No CVA tenderness Genito Urinary  Vulva/vagina: Normal external female genitalia. No lesions. No discharge or bleeding. Bladder/urethra: No lesions or masses, well supported bladder Vagina: no lesions Cervix: Normal appearing, no lesions. Uterus: Small, mobile, no parametrial involvement or nodularity. Adnexa: no palpable masses. Rectal  Good tone, no masses no cul de sac nodularity.  Extremities  No bilateral cyanosis, clubbing or edema.   Donaciano Eva, MD

## 2014-11-13 NOTE — Anesthesia Preprocedure Evaluation (Addendum)
Anesthesia Evaluation  Patient identified by MRN, date of birth, ID band Patient awake    Reviewed: Allergy & Precautions, NPO status , Patient's Chart, lab work & pertinent test results  Airway Mallampati: II  TM Distance: >3 FB Neck ROM: Full    Dental no notable dental hx.    Pulmonary neg pulmonary ROS, Current Smoker,  breath sounds clear to auscultation  Pulmonary exam normal       Cardiovascular negative cardio ROS Normal cardiovascular examRhythm:Regular Rate:Normal     Neuro/Psych negative neurological ROS  negative psych ROS   GI/Hepatic negative GI ROS, Neg liver ROS,   Endo/Other  Morbid obesity  Renal/GU negative Renal ROS  negative genitourinary   Musculoskeletal negative musculoskeletal ROS (+)   Abdominal   Peds negative pediatric ROS (+)  Hematology negative hematology ROS (+)   Anesthesia Other Findings   Reproductive/Obstetrics negative OB ROS                           Anesthesia Physical Anesthesia Plan  ASA: II  Anesthesia Plan: General   Post-op Pain Management:    Induction: Intravenous  Airway Management Planned: Oral ETT  Additional Equipment:   Intra-op Plan:   Post-operative Plan: Extubation in OR  Informed Consent: I have reviewed the patients History and Physical, chart, labs and discussed the procedure including the risks, benefits and alternatives for the proposed anesthesia with the patient or authorized representative who has indicated his/her understanding and acceptance.   Dental advisory given  Plan Discussed with: CRNA  Anesthesia Plan Comments:         Anesthesia Quick Evaluation

## 2014-11-13 NOTE — Transfer of Care (Signed)
Immediate Anesthesia Transfer of Care Note  Patient: Katherine Bean  Procedure(s) Performed: Procedure(s): ROBOTIC ASSISTED RIGHT SALPINGO OOPHORECTOMY LYSIS OF ADHESIONS (Right)  Patient Location: PACU  Anesthesia Type:General  Level of Consciousness:  sedated, patient cooperative and responds to stimulation  Airway & Oxygen Therapy:Patient Spontanous Breathing and Patient connected to face mask oxgen  Post-op Assessment:  Report given to PACU RN and Post -op Vital signs reviewed and stable  Post vital signs:  Reviewed and stable  Last Vitals:  Filed Vitals:   11/13/14 0725  BP: 138/77  Pulse: 96  Temp: 37.1 C  Resp: 16    Complications: No apparent anesthesia complications

## 2014-11-13 NOTE — Anesthesia Procedure Notes (Signed)
Procedure Name: Intubation Date/Time: 11/13/2014 12:53 PM Performed by: Dione Booze Pre-anesthesia Checklist: Patient identified, Emergency Drugs available, Suction available and Patient being monitored Patient Re-evaluated:Patient Re-evaluated prior to inductionOxygen Delivery Method: Circle system utilized Preoxygenation: Pre-oxygenation with 100% oxygen Intubation Type: IV induction Ventilation: Oral airway inserted - appropriate to patient size Laryngoscope Size: Sabra Heck and 2 Grade View: Grade II Tube type: Oral Tube size: 7.5 mm Number of attempts: 1 Airway Equipment and Method: Stylet Placement Confirmation: ETT inserted through vocal cords under direct vision,  positive ETCO2 and breath sounds checked- equal and bilateral Secured at: 21 cm Tube secured with: Tape Dental Injury: Teeth and Oropharynx as per pre-operative assessment

## 2014-11-13 NOTE — Anesthesia Postprocedure Evaluation (Signed)
  Anesthesia Post-op Note  Patient: Katherine Bean  Procedure(s) Performed: Procedure(s) (LRB): ROBOTIC ASSISTED RIGHT SALPINGO OOPHORECTOMY LYSIS OF ADHESIONS (Right)  Patient Location: PACU  Anesthesia Type: General  Level of Consciousness: awake and alert   Airway and Oxygen Therapy: Patient Spontanous Breathing  Post-op Pain: mild  Post-op Assessment: Post-op Vital signs reviewed, Patient's Cardiovascular Status Stable, Respiratory Function Stable, Patent Airway and No signs of Nausea or vomiting  Last Vitals:  Filed Vitals:   11/13/14 1735  BP: 118/46  Pulse: 68  Temp:   Resp: 18    Post-op Vital Signs: stable   Complications: No apparent anesthesia complications

## 2014-11-13 NOTE — Discharge Instructions (Signed)
Unilateral Salpingo-Oophorectomy Unilateral salpingo-oophorectomy is the surgical removal of one fallopian tube and ovary. The ovaries are small organs that produce eggs in women. The fallopian tubes transport the egg from the ovary to the womb (uterus). A unilateral salpingo-oophorectomy may be done for various reasons, including:  Infection in the fallopian tube and ovary.  Scar tissue in the fallopian tube and ovary (adhesions).  A cyst or tumor on the ovary.  A need to remove the fallopian tube and ovary when removing the uterus.  Cancer of the fallopian tube or ovary. The removal of one fallopian tube and ovary will not prevent you from becoming pregnant, put you into menopause, or cause problems with your menstrual periods or sex drive. LET [facility] CARE PROVIDER KNOW ABOUT:  Any allergies you have.  All medicines you are taking, including vitamins, herbs, eye drops, creams, and over-the-counter medicines.  Previous problems you or members of your family have had with the use of anesthetics.  Any blood disorders you have.  Previous surgeries you have had.  Medical conditions you have. RISKS AND COMPLICATIONS  Generally, this is a safe procedure. However, as with any procedure, complications can occur. Possible complications include:  Injury to surrounding organs.  Bleeding.  Infection.  Blood clots in the legs or lungs.  Problems related to anesthesia. BEFORE THE PROCEDURE  Ask your health care provider about changing or stopping your regular medicines. You may need to stop taking certain medicines, such as aspirin or blood thinners, at least 1 week before the surgery.  Do not eat or drink anything for at least 8 hours before the surgery.  If you smoke, do not smoke for at least 2 weeks before the surgery.  Make plans to have someone drive you home after the procedure or after your hospital stay. Also arrange for someone to help you with activities during  recovery. PROCEDURE  You will be given medicine to help you relax before the procedure (sedative). You will then be given medicine to make you sleep through the procedure (general anesthetic). These medicines will be given through an IV access tube that is put into one of your veins.  Once you are asleep, your lower abdomen will be shaved and cleaned. A thin, flexible tube (catheter) will be placed in your bladder.  The surgeon may use a laparoscopic, robotic, or open technique for this surgery:  In the laparoscopic technique, the surgery is done through two small cuts (incisions) in the abdomen. A thin, lighted tube with a tiny camera on the end (laparoscope) is inserted into one of the incisions. The tools needed for the procedure are put through the other incision.  A robotic technique may be chosen to perform complex surgery in a small space. In the robotic technique, small incisions are made. A camera and surgical instruments are passed through the incisions. Surgical instruments are controlled with the help of a robotic arm.  In the open technique, the surgery is done through one large incision in the abdomen.  Using any of these techniques, the surgeon will remove the fallopian tube and ovary. The blood vessels will be clamped and tied.  The surgeon will then use staples or stitches to close the incision or incisions. AFTER THE PROCEDURE  You will be taken to a recovery area where your progress will be monitored for 1-3 hours. Your blood pressure, pulse, and temperature will be checked often. You will remain in the recovery area until you are stable and waking up.  If the laparoscopic technique was used, you may be allowed to go home after several hours. You may have some shoulder pain. This is normal and usually goes away in a day or two.  If the open technique was used, you will be admitted to the hospital for a couple of days.  You will be given pain medicine as necessary.  The  IV tube and catheter will be removed before you are discharged. Document Released: 03/15/2009 Document Revised: 05/23/2013 Document Reviewed: 11/09/2012 [facility] Patient Information 2015 [place], [place]. This information is not intended to replace advice given to you by your health care provider. Make sure you discuss any questions you have with your health care provider.   Return to work: 2 weeks  Activity: 1. Be up and out of the bed during the day.  Take a nap if needed.  You may walk up steps but be careful and use the hand rail.  Stair climbing will tire you more than you think, you may need to stop part way and rest.   2. No lifting or straining for 6 weeks.  3. No driving for 1-2 weeks.  Do Not drive if you are taking narcotic pain medicine.  4. Shower daily.  Use soap and water on your incision and pat dry; don't rub.   5. No sexual activity and nothing in the vagina for 4 weeks.  Diet: 1. Low sodium Heart Healthy Diet is recommended.  2. It is safe to use a laxative if you have difficulty moving your bowels.   Wound Care: 1. Keep clean and dry.  Shower daily.  Reasons to call the Doctor:   Fever - Oral temperature greater than 100.4 degrees Fahrenheit  Foul-smelling vaginal discharge  Difficulty urinating  Nausea and vomiting  Increased pain at the site of the incision that is unrelieved with pain medicine.  Difficulty breathing with or without chest pain  New calf pain especially if only on one side  Sudden, continuing increased vaginal bleeding with or without clots.   Follow-up: 1. See Everitt Amber in 4 weeks.  Contacts: For questions or concerns you should contact:  Dr. Everitt Amber at [phone]  or at [place]

## 2014-11-14 ENCOUNTER — Telehealth: Payer: Self-pay | Admitting: Nurse Practitioner

## 2014-11-14 ENCOUNTER — Encounter (HOSPITAL_COMMUNITY): Payer: Self-pay | Admitting: Gynecologic Oncology

## 2014-11-14 NOTE — Telephone Encounter (Signed)
Patient calling to schedule 1 month post-op appointment with Dr. Denman George. RN gave apt for July 14 at 12:00, arrival time of 11:45. Patient is aware and verbalizes understanding of apt time and date.

## 2014-11-15 ENCOUNTER — Telehealth: Payer: Self-pay | Admitting: *Deleted

## 2014-11-15 NOTE — Telephone Encounter (Addendum)
Received VM from patient regarding vaginal bleeding and requesting a return call. Called and spoke with patient and she states she started bleeding last night like a period, denies passing any blood clots or any other symptoms. Per patient, she filled up two thin pads overnight. Prior to surgery, patient was still having her period and states they have been irregular for a long time. Discussed with Joylene John, NP - patient instructed that the bleeding is probably related to hormone changes in her body since she had one of her ovaries removed.  Instructed patient to continue to monitor the bleeding and to call our office back if bleeding continues to increase or if she develops any clots. Also, instructed patient that if it is after hours or on the weekend to report to the closest emergency room. Patient agreeable to this. While on the phone, patient inquired about stool softener or laxative. Told patient it is safe to try colace or miralax OTC to move her bowels - patient agreeable to this. No other questions or concerns noted at this time.

## 2014-12-13 ENCOUNTER — Ambulatory Visit: Payer: BLUE CROSS/BLUE SHIELD | Admitting: Gynecologic Oncology

## 2014-12-13 ENCOUNTER — Encounter: Payer: Self-pay | Admitting: Gynecologic Oncology

## 2014-12-17 ENCOUNTER — Encounter: Payer: Self-pay | Admitting: Gynecologic Oncology

## 2014-12-17 ENCOUNTER — Ambulatory Visit: Payer: BLUE CROSS/BLUE SHIELD | Attending: Gynecologic Oncology | Admitting: Gynecologic Oncology

## 2014-12-17 VITALS — BP 126/76 | HR 88 | Temp 98.4°F | Resp 16 | Ht 61.0 in | Wt 230.8 lb

## 2014-12-17 DIAGNOSIS — D369 Benign neoplasm, unspecified site: Secondary | ICD-10-CM | POA: Diagnosis not present

## 2014-12-17 NOTE — Patient Instructions (Signed)
You have no scheduled appointment with Dr. Denman George, and we are happy to follow up with you as needed.  If you have any questions or needs in the future, please do not hesitate to call our clinic.

## 2014-12-17 NOTE — Progress Notes (Signed)
POSTOPERATIVE FOLLOWUP  Assessment:    49 y.o. year old with right ovarian cyst.   S/p robotic RSO on 11/13/14.   Plan: 1) Pathology reports reviewed today 2) Treatment counseling - I discussed benign nature of her lesion and our recommendation for no specific followup. I discussed that it is common for women to experience heavy menses when they are perimenopausal. Hysterectomy is only indicated after failure of conservative therapies such as medical therapies (including IUD), and or endometrial ablation.  She will discuss these options further with Dr Radene Knee if her heavy periods continue. She was given the opportunity to ask questions, which were answered to her satisfaction, and she is agreement with the above mentioned plan of care.  3)  Return to clinic prn basis only.  HPI:  Katherine Bean is a 49 y.o. year old No obstetric history on file. initially seen for right ovarian cyst, referred by Dr Radene Knee.  She then underwent a robotic RSO on 01/17/28 without complications.  Her postoperative course was uncomplicated.  Her final pathology revealed mucinous, benign ovarian cyst.  She is seen today for a postoperative check and to discuss her pathology results and ongoing plan.  Since discharge from the hospital, she is feeling well.  She has improving appetite, normal bowel and bladder function, and pain controlled with minimal PO medication. She has no other complaints today with the exception of a heavier than usual period after the surgery.    Review of systems: Constitutional:  She has no weight gain or weight loss. She has no fever or chills. Eyes: No blurred vision Ears, Nose, Mouth, Throat: No dizziness, headaches or changes in hearing. No mouth sores. Cardiovascular: No chest pain, palpitations or edema. Respiratory:  No shortness of breath, wheezing or cough Gastrointestinal: She has normal bowel movements without diarrhea or constipation. She denies any nausea or vomiting. She denies  blood in her stool or heart burn. Genitourinary:  She denies pelvic pain, pelvic pressure or changes in her urinary function. She has no hematuria, dysuria, or incontinence. She has no irregular vaginal bleeding or vaginal discharge Musculoskeletal: Denies muscle weakness or joint pains.  Skin:  She has no skin changes, rashes or itching Neurological:  Denies dizziness or headaches. No neuropathy, no numbness or tingling. Psychiatric:  She denies depression or anxiety. Hematologic/Lymphatic:   No easy bruising or bleeding   Physical Exam: Blood pressure 126/76, pulse 88, temperature 98.4 F (36.9 C), resp. rate 16, height 5\' 1"  (1.549 m), weight 230 lb 12.8 oz (104.69 kg), last menstrual period 12/13/2014. General: Well dressed, well nourished in no apparent distress.   HEENT:  Normocephalic and atraumatic, no lesions.  Extraocular muscles intact. Sclerae anicteric. Pupils equal, round, reactive. No mouth sores or ulcers. Thyroid is normal size, not nodular, midline. Skin:  No lesions or rashes. Breasts:  deferred Lungs:  deferred Cardiovascular:  deferred Abdomen:  Soft, nontender, nondistended.  No palpable masses.  No hepatosplenomegaly.  No ascites. Normal bowel sounds.  No hernias.  Incisions are healing well. Genitourinary: deferred Extremities: No cyanosis, clubbing or edema.  No calf tenderness or erythema. No palpable cords. Psychiatric: Mood and affect are appropriate. Neurological: Awake, alert and oriented x 3. Sensation is intact, no neuropathy.  Musculoskeletal: No pain, normal strength and range of motion.  Donaciano Eva, MD

## 2015-12-02 ENCOUNTER — Ambulatory Visit: Payer: BLUE CROSS/BLUE SHIELD | Admitting: Podiatry

## 2018-06-16 ENCOUNTER — Emergency Department (HOSPITAL_COMMUNITY): Payer: BLUE CROSS/BLUE SHIELD

## 2018-06-16 ENCOUNTER — Ambulatory Visit (HOSPITAL_COMMUNITY)
Admission: EM | Admit: 2018-06-16 | Discharge: 2018-06-16 | Disposition: A | Discharge status: Home / Self Care | Payer: BLUE CROSS/BLUE SHIELD | Source: Home / Self Care

## 2018-06-16 ENCOUNTER — Emergency Department (HOSPITAL_COMMUNITY)
Admission: EM | Admit: 2018-06-16 | Discharge: 2018-06-16 | Disposition: A | Discharge status: Home / Self Care | Payer: BLUE CROSS/BLUE SHIELD | Attending: Emergency Medicine | Admitting: Emergency Medicine

## 2018-06-16 ENCOUNTER — Encounter (HOSPITAL_COMMUNITY): Payer: Self-pay

## 2018-06-16 DIAGNOSIS — R509 Fever, unspecified: Secondary | ICD-10-CM | POA: Diagnosis not present

## 2018-06-16 DIAGNOSIS — R1033 Periumbilical pain: Secondary | ICD-10-CM

## 2018-06-16 DIAGNOSIS — R1030 Lower abdominal pain, unspecified: Secondary | ICD-10-CM | POA: Diagnosis present

## 2018-06-16 DIAGNOSIS — F17210 Nicotine dependence, cigarettes, uncomplicated: Secondary | ICD-10-CM | POA: Insufficient documentation

## 2018-06-16 DIAGNOSIS — L03311 Cellulitis of abdominal wall: Secondary | ICD-10-CM | POA: Insufficient documentation

## 2018-06-16 LAB — COMPREHENSIVE METABOLIC PANEL
ALBUMIN: 3.6 g/dL (ref 3.5–5.0)
ALT: 16 U/L (ref 0–44)
ANION GAP: 9 (ref 5–15)
AST: 20 U/L (ref 15–41)
Alkaline Phosphatase: 65 U/L (ref 38–126)
BUN: 8 mg/dL (ref 6–20)
CHLORIDE: 104 mmol/L (ref 98–111)
CO2: 26 mmol/L (ref 22–32)
Calcium: 9.1 mg/dL (ref 8.9–10.3)
Creatinine, Ser: 0.95 mg/dL (ref 0.44–1.00)
GFR calc Af Amer: >60 mL/min (ref >60)
GFR calc non Af Amer: >60 mL/min (ref >60)
Glucose, Bld: 119 mg/dL — ABNORMAL HIGH (ref 70–99)
Potassium: 3.8 mmol/L (ref 3.5–5.1)
Sodium: 139 mmol/L (ref 135–145)
Total Bilirubin: 0.7 mg/dL (ref 0.3–1.2)
Total Protein: 7.5 g/dL (ref 6.5–8.1)

## 2018-06-16 LAB — URINALYSIS, ROUTINE W REFLEX MICROSCOPIC
BILIRUBIN URINE: NEGATIVE
Glucose, UA: NEGATIVE mg/dL
Ketones, ur: NEGATIVE mg/dL
Nitrite: NEGATIVE
PROTEIN: NEGATIVE mg/dL
SPECIFIC GRAVITY, URINE: 1.021 (ref 1.005–1.030)
pH: 7 (ref 5.0–8.0)

## 2018-06-16 LAB — CBC
HEMATOCRIT: 44 % (ref 36.0–46.0)
HEMOGLOBIN: 13.3 g/dL (ref 12.0–15.0)
MCH: 23.1 pg — ABNORMAL LOW (ref 26.0–34.0)
MCHC: 30.2 g/dL (ref 30.0–36.0)
MCV: 76.4 fL — AB (ref 80.0–100.0)
NRBC: 0 % (ref 0.0–0.2)
Platelets: 396 10*3/uL (ref 150–400)
RBC: 5.76 MIL/uL — AB (ref 3.87–5.11)
RDW: 15.3 % (ref 11.5–15.5)
WBC: 9.4 10*3/uL (ref 4.0–10.5)

## 2018-06-16 LAB — LIPASE, BLOOD: LIPASE: 28 U/L (ref 11–51)

## 2018-06-16 LAB — I-STAT CG4 LACTIC ACID, ED: Lactic Acid, Venous: 1.81 mmol/L (ref 0.5–1.9)

## 2018-06-16 MED ORDER — SODIUM CHLORIDE 0.9 % IV SOLN
1.0000 g | Freq: Once | INTRAVENOUS | Status: AC
  Administered 2018-06-16: 1 g via INTRAVENOUS
  Filled 2018-06-16: qty 10

## 2018-06-16 MED ORDER — SODIUM CHLORIDE 0.9% FLUSH: 3.0000 mL | Freq: Once | INTRAVENOUS | Status: DC

## 2018-06-16 MED ORDER — IOHEXOL 300 MG/ML  SOLN
100.0000 mL | Freq: Once | INTRAMUSCULAR | Status: AC | PRN
  Administered 2018-06-16: 100 mL via INTRAVENOUS

## 2018-06-16 MED ORDER — CEPHALEXIN 500 MG PO CAPS: 1000.0000 mg | ORAL_CAPSULE | Freq: Two times a day (BID) | ORAL | 0 refills | Status: AC

## 2018-06-16 MED ORDER — SULFAMETHOXAZOLE-TRIMETHOPRIM 800-160 MG PO TABS
1.0000 | ORAL_TABLET | Freq: Once | ORAL | Status: AC
  Administered 2018-06-16: 1 via ORAL
  Filled 2018-06-16: qty 1

## 2018-06-16 MED ORDER — SULFAMETHOXAZOLE-TRIMETHOPRIM 800-160 MG PO TABS: 1.0000 | ORAL_TABLET | Freq: Two times a day (BID) | ORAL | 0 refills | Status: AC

## 2018-06-16 NOTE — ED Provider Notes (Signed)
[facility]    CSN: [id number] Arrival date & time: 06/16/18  4010     History   Chief Complaint Chief Complaint  Patient presents with  . Abdominal Pain    HPI Katherine Bean is a 53 y.o. female.   Patient is a 53 year old female that presents with approximately 1 week of umbilicus pain.  This has been constant and worsening.  She does have a history of a hernia repair approximate 20 years ago with mesh.  She had a low-grade fever last night.  She has been eating and drinking and denies any nausea or vomiting.  The area is red, warm and painful to touch.  Denies any injuries to the abdomen.  ROS per HPI      History reviewed. No pertinent past medical history.  Patient Active Problem List   Diagnosis Date Noted  . Morbid obesity with BMI of 40.0-44.9, adult ([place]) 09/28/2014  . Right ovarian cyst 09/28/2014    Past Surgical History:  Procedure Laterality Date  . CHOLECYSTECTOMY     Laparoscopic  . HERNIA REPAIR     19years ago  with mesh  . ROBOTIC ASSISTED SALPINGO OOPHERECTOMY Right 11/13/2014   Procedure: ROBOTIC ASSISTED RIGHT SALPINGO OOPHORECTOMY LYSIS OF ADHESIONS;  Surgeon: Everitt Amber, MD;  Location: [facility];  Service: Gynecology;  Laterality: Right;  . TUBAL LIGATION      OB History   No obstetric history on file.      Home Medications    Prior to Admission medications   Medication Sig Start Date End Date Taking? Authorizing Provider  clotrimazole-betamethasone (LOTRISONE) cream Apply 1 application topically 2 (two) times daily as needed (bacterial infection).  08/28/14   [provider]  HYDROmorphone (DILAUDID) 2 MG tablet Take 1 tablet (2 mg total) by mouth every 6 (six) hours as needed for severe pain. 11/13/14   Lahoma Crocker, MD    Family History Family History  Problem Relation Age of Onset  . Kidney failure Mother   . Healthy Father     Social History Social History   Tobacco Use  . Smoking status: Current  Every Day Smoker    Packs/day: 0.50    Years: 10.00    Pack years: 5.00    Types: Cigarettes  . Smokeless tobacco: Never Used  Substance Use Topics  . Alcohol use: Yes    Alcohol/week: 1.0 standard drinks    Types: 1 Glasses of wine per week    Comment: social -occ.  . Drug use: No     Allergies   Shellfish allergy; Metronidazole; and Oxycodone hcl   Review of Systems Review of Systems   Physical Exam Triage Vital Signs ED Triage Vitals  Enc Vitals Group     BP 06/16/18 1001 (!) 97/58     Pulse Rate 06/16/18 1001 84     Resp 06/16/18 1001 18     Temp 06/16/18 1001 98 F (36.7 C)     Temp src --      SpO2 06/16/18 1001 98 %     Weight --      Height --      Head Circumference --      Peak Flow --      Pain Score 06/16/18 0959 6     Pain Loc --      Pain Edu? --      Excl. in [place]? --    No data found.  Updated Vital Signs BP (!) 97/58  Pulse 84   Temp 98 F (36.7 C)   Resp 18   LMP 12/13/2014   SpO2 98%   Visual Acuity Right Eye Distance:   Left Eye Distance:   Bilateral Distance:    Right Eye Near:   Left Eye Near:    Bilateral Near:     Physical Exam Vitals signs and nursing note reviewed.  Constitutional:      General: She is not in acute distress.    Appearance: She is well-developed. She is not ill-appearing, toxic-appearing or diaphoretic.  HENT:     Head: Normocephalic and atraumatic.  Pulmonary:     Effort: Pulmonary effort is normal.  Abdominal:     Palpations: Abdomen is soft.     Tenderness: There is abdominal tenderness in the periumbilical area.     Hernia: A hernia is present. Hernia is present in the umbilical area.     Comments: Very tender to palpation of umbilicus.  Hard to touch with surrounding erythema and increased warmth. Unable to reduce.   Skin:    General: Skin is warm and dry.  Neurological:     Mental Status: She is alert.  Psychiatric:        Mood and Affect: Mood normal.      UC Treatments / Results    Labs (all labs ordered are listed, but only abnormal results are displayed) Labs Reviewed - No data to display  EKG None  Radiology No results found.  Procedures Procedures (including critical care time)  Medications Ordered in UC Medications - No data to display  Initial Impression / Assessment and Plan / UC Course  I have reviewed the triage vital signs and the nursing notes.  Pertinent labs & imaging results that were available during my care of the patient were reviewed by me and considered in my medical decision making (see chart for details).     Based on exam I am very concerned for incarcerated hernia. We will send to the ER for further evaluation and management Final Clinical Impressions(s) / UC Diagnoses   Final diagnoses:  None     Discharge Instructions     I am worried that you have an incarcerated umbilical hernia.  You need to go to the emergency room for ultrasound or CT scan     ED Prescriptions    None     Controlled Substance Prescriptions [place] Controlled Substance Registry consulted? Not Applicable   Orvan July, NP 06/16/18 1058

## 2018-06-16 NOTE — Discharge Instructions (Signed)
1.  Schedule a follow-up appointment with [facility]. 2.  Apply a warm compress to the area for about 20 minutes every several hours. 3.  Return to the emergency department if you have increased swelling, rapidly expanding redness, fevers, worsening pain or other concerning symptoms.

## 2018-06-16 NOTE — Discharge Instructions (Signed)
I am worried that you have an incarcerated umbilical hernia.  You need to go to the emergency room for ultrasound or CT scan

## 2018-06-16 NOTE — ED Triage Notes (Signed)
Pt presents with complaints of abdominal pain and fever yesterday. Concerned for repeat issue with her umbilical hernia.

## 2018-06-16 NOTE — ED Provider Notes (Signed)
[facility] EMERGENCY DEPARTMENT Provider Note   CSN: [id number] Arrival date & time: 06/16/18  1058     History   Chief Complaint Chief Complaint  Patient presents with  . Abdominal Pain    HPI Katherine Bean is a 53 y.o. female.  HPI Patient has had 1 week of pain around her umbilicus.  She was seen at urgent care and referred to the emergency department for evaluation of possible hernia.  She reports over the past couple days the areas become more red and swollen and painful.  It is most uncomfortable when her garments press on it.  She reports last night she thought she has some fever being warm to the touch.  No measured temperature.  No nausea no vomiting no general malaise.  She reports she had an umbilical hernia repair over 20 years ago and had no problems. History reviewed. No pertinent past medical history.  Patient Active Problem List   Diagnosis Date Noted  . Morbid obesity with BMI of 40.0-44.9, adult ([place]) 09/28/2014  . Right ovarian cyst 09/28/2014    Past Surgical History:  Procedure Laterality Date  . CHOLECYSTECTOMY     Laparoscopic  . HERNIA REPAIR     19years ago  with mesh  . ROBOTIC ASSISTED SALPINGO OOPHERECTOMY Right 11/13/2014   Procedure: ROBOTIC ASSISTED RIGHT SALPINGO OOPHORECTOMY LYSIS OF ADHESIONS;  Surgeon: Everitt Amber, MD;  Location: [facility];  Service: Gynecology;  Laterality: Right;  . TUBAL LIGATION       OB History   No obstetric history on file.      Home Medications    Prior to Admission medications   Medication Sig Start Date End Date Taking? Authorizing Provider  acetaminophen (TYLENOL) 325 MG tablet Take 650 mg by mouth every 6 (six) hours as needed for mild pain.    Yes [provider]  naproxen sodium (ALEVE) 220 MG tablet Take 220 mg by mouth daily as needed (pain).    Yes [provider]  cephALEXin (KEFLEX) 500 MG capsule Take 2 capsules (1,000 mg total) by mouth 2 (two) times daily.  06/16/18   Charlesetta Shanks, MD  HYDROmorphone (DILAUDID) 2 MG tablet Take 1 tablet (2 mg total) by mouth every 6 (six) hours as needed for severe pain. Patient not taking: Reported on 06/16/2018 11/13/14   Lahoma Crocker, MD  sulfamethoxazole-trimethoprim (BACTRIM DS,SEPTRA DS) 800-160 MG tablet Take 1 tablet by mouth 2 (two) times daily for 7 days. 06/16/18 06/23/18  Charlesetta Shanks, MD    Family History Family History  Problem Relation Age of Onset  . Kidney failure Mother   . Healthy Father     Social History Social History   Tobacco Use  . Smoking status: Current Every Day Smoker    Packs/day: 0.50    Years: 10.00    Pack years: 5.00    Types: Cigarettes  . Smokeless tobacco: Never Used  Substance Use Topics  . Alcohol use: Yes    Alcohol/week: 1.0 standard drinks    Types: 1 Glasses of wine per week    Comment: social -occ.  . Drug use: No     Allergies   Shellfish allergy; Metronidazole; and Oxycodone hcl   Review of Systems Review of Systems 10 Systems reviewed and are negative for acute change except as noted in the HPI.   Physical Exam Updated Vital Signs BP (!) 147/87   Pulse 87   Temp 98.2 F (36.8 C) (Oral)   Resp  19   LMP 12/13/2014   SpO2 100%   Physical Exam Constitutional:      Appearance: She is well-developed.  HENT:     Head: Normocephalic and atraumatic.  Eyes:     Pupils: Pupils are equal, round, and reactive to light.  Neck:     Musculoskeletal: Neck supple.  Cardiovascular:     Rate and Rhythm: Normal rate and regular rhythm.     Heart sounds: Normal heart sounds.  Pulmonary:     Effort: Pulmonary effort is normal.     Breath sounds: Normal breath sounds.  Abdominal:     General: Bowel sounds are normal. There is no distension.     Palpations: Abdomen is soft.     Tenderness: There is abdominal tenderness.     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  There is erythema of this area and then a circumferential  approximately a 5 cm round area of erythema.  This is tender.  No palpable fluctuance or mass.  Musculoskeletal: Normal range of motion.  Skin:    General: Skin is warm and dry.  Neurological:     Mental Status: She is alert and oriented to person, place, and time.       GCS: GCS eye subscore is 4. GCS verbal subscore is 5. GCS motor subscore is 6.     Coordination: Coordination normal.      ED Treatments / Results  Labs (all labs ordered are listed, but only abnormal results are displayed) Labs Reviewed  COMPREHENSIVE METABOLIC PANEL - Abnormal; Notable for the following components:      Result Value   Glucose, Bld 119 (*)    All other components within normal limits  CBC - Abnormal; Notable for the following components:   RBC 5.76 (*)    MCV 76.4 (*)    MCH 23.1 (*)    All other components within normal limits  URINALYSIS, ROUTINE W REFLEX MICROSCOPIC - Abnormal; Notable for the following components:   APPearance HAZY (*)    Hgb urine dipstick SMALL (*)    Leukocytes, UA SMALL (*)    Bacteria, UA RARE (*)    All other components within normal limits  LIPASE, BLOOD  I-STAT CG4 LACTIC ACID, ED  I-STAT CG4 LACTIC ACID, ED    EKG None  Radiology Ct Abdomen Pelvis W Contrast  Result Date: 06/16/2018 CLINICAL DATA:  Lower abdominal pain for the past 2 days. Fever yesterday. Previous cholecystectomy and umbilical hernia repair. EXAM: CT ABDOMEN AND PELVIS WITH CONTRAST TECHNIQUE: Multidetector CT imaging of the abdomen and pelvis was performed using the standard protocol following bolus administration of intravenous contrast. CONTRAST:  181mL OMNIPAQUE IOHEXOL 300 MG/ML  SOLN COMPARISON:  None. FINDINGS: Lower chest: Minimal right basilar atelectasis or scarring. Hepatobiliary: No focal liver abnormality is seen. Status post cholecystectomy. No biliary dilatation. Pancreas: Unremarkable. No pancreatic ductal dilatation or surrounding inflammatory changes. Spleen: Normal in size  without focal abnormality. Adrenals/Urinary Tract: Adrenal glands are unremarkable. Kidneys are normal, without renal calculi, focal lesion, or hydronephrosis. Bladder is unremarkable. Stomach/Bowel: Stomach is within normal limits. Appendix appears normal. No evidence of bowel wall thickening, distention, or inflammatory changes. Vascular/Lymphatic: No significant vascular findings are present. No enlarged abdominal or pelvic lymph nodes. Reproductive: Uterus and bilateral adnexa are unremarkable. Other: Post umbilical hernia repair scarring. 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 IMPRESSION: No acute abnormality. Electronically Signed   By: Percell Locus.D.  On: 06/16/2018 14:30    Procedures Procedures (including critical care time)  Medications Ordered in ED Medications  sodium chloride flush (NS) 0.9 % injection 3 mL (0 mLs Intravenous Hold 06/16/18 1210)  cefTRIAXone (ROCEPHIN) 1 g in sodium chloride 0.9 % 100 mL IVPB (1 g Intravenous New Bag/Given 06/16/18 1551)  iohexol (OMNIPAQUE) 300 MG/ML solution 100 mL (100 mLs Intravenous Contrast Given 06/16/18 1403)  sulfamethoxazole-trimethoprim (BACTRIM DS,SEPTRA DS) 800-160 MG per tablet 1 tablet (1 tablet Oral Given 06/16/18 1551)     Initial Impression / Assessment and Plan / ED Course  I have reviewed the triage vital signs and the nursing notes.  Pertinent labs & imaging results that were available during my care of the patient were reviewed by me and considered in my medical decision making (see chart for details).    Patient is tender periumbilical erythema.  CT does not show actual umbilical hernia.  Is there is no fluid collection of abscess present.  By physical exam, findings are consistent with cellulitis and area of induration.  Will start the patient on antibiotic coverage.  Recommend follow-up with general surgery.  Return precautions reviewed.  Patient is nontoxic and alert  otherwise clinically well.  Final Clinical Impressions(s) / ED Diagnoses   Final diagnoses:  Cellulitis of abdominal wall    ED Discharge Orders         Ordered    cephALEXin (KEFLEX) 500 MG capsule  2 times daily     06/16/18 1556    sulfamethoxazole-trimethoprim (BACTRIM DS,SEPTRA DS) 800-160 MG tablet  2 times daily     06/16/18 1556           Charlesetta Shanks, MD 06/16/18 1604

## 2018-06-16 NOTE — ED Triage Notes (Signed)
Pt presents from [facility] for concern over incarcerated umbilical hernia. Pt has had abd pain with hx of hernia worsening over past few days. Denies N/V/D. Fever reported last night.

## 2019-05-26 IMAGING — CT CT ABD-PELV W/ CM
2 of 5 series · 16 of 46 positions shown, 18 images · IV contrast (Omni 300)
Comparison: None.

CLINICAL DATA: Lower abdominal pain for the past 2 days. Fever
yesterday. Previous cholecystectomy and umbilical hernia repair.

EXAM:
CT ABDOMEN AND PELVIS WITH CONTRAST
TECHNIQUE: Multidetector CT imaging of the abdomen and pelvis was performed
using the standard protocol following bolus administration of
intravenous contrast.
CONTRAST:  100mL OMNIPAQUE IOHEXOL 300 MG/ML  SOLN

[Series 3: a/p w/ 5mm · axial · 0.65mm/px · z∈[+848,+1233]mm · 13 of 87 slices shown, 15 images]
[im 5/87  soft-tissue]
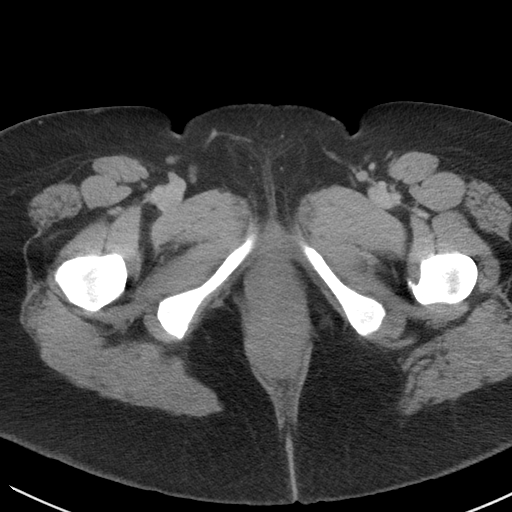
[im 5/87  bone]
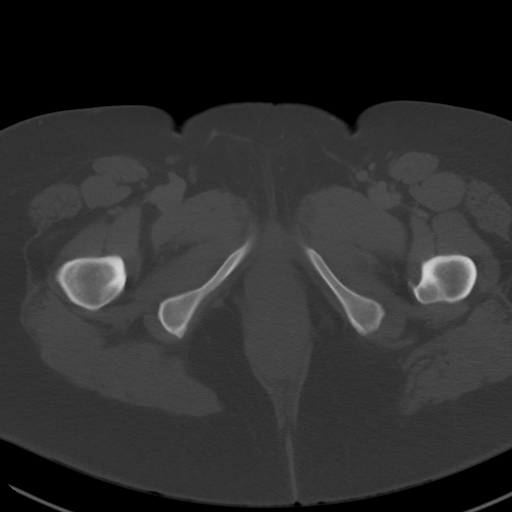
[im 10/87  soft-tissue]
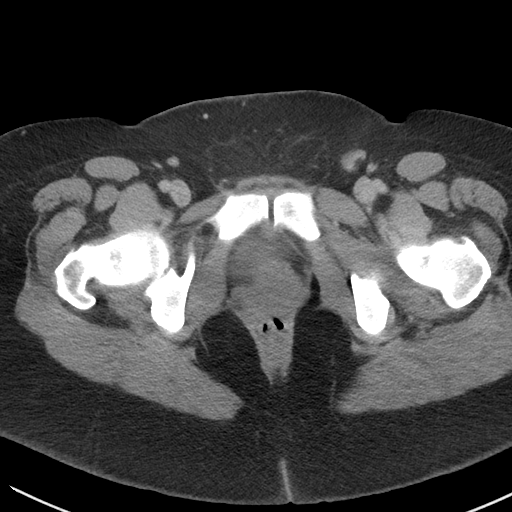
[im 20/87  soft-tissue]
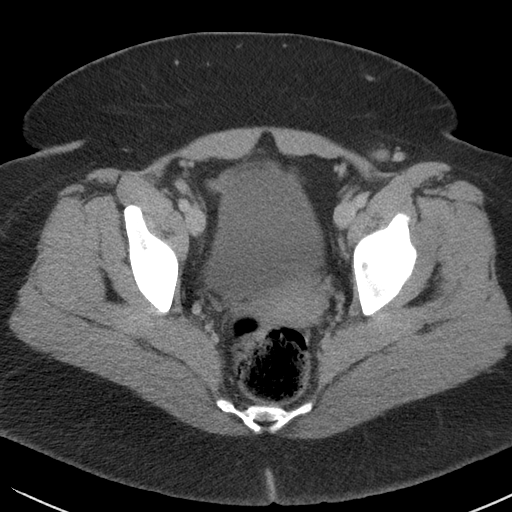
[im 24/87  soft-tissue]
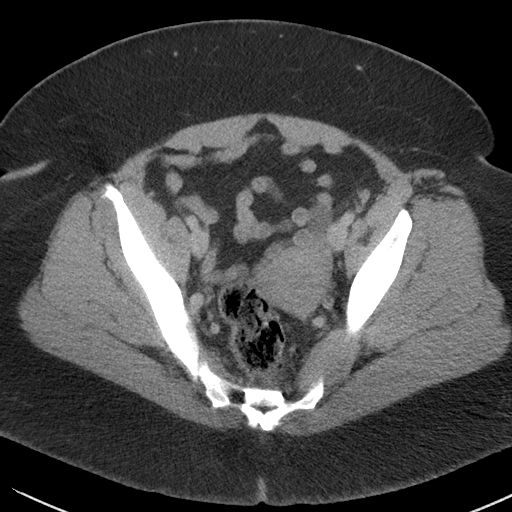
[im 29/87  soft-tissue]
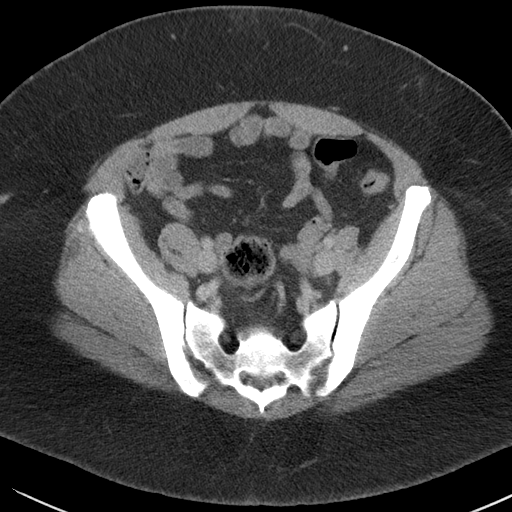
[im 39/87  soft-tissue]
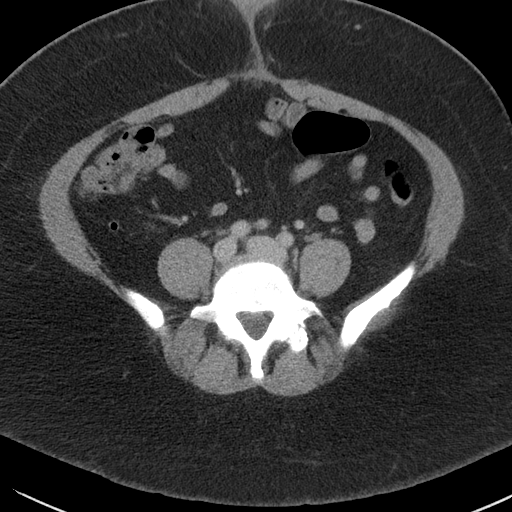
[im 44/87  soft-tissue]
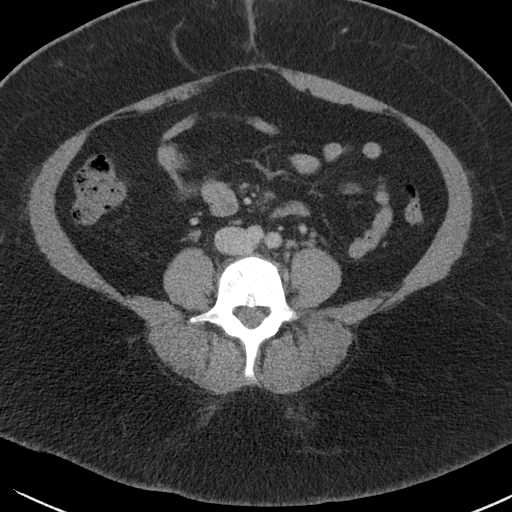
[im 48/87  soft-tissue]
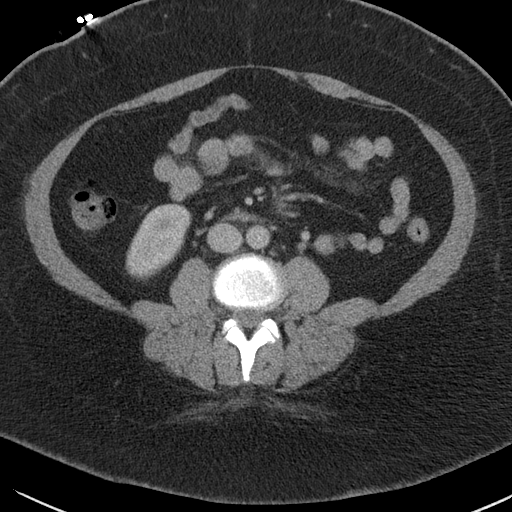
[im 58/87  soft-tissue]
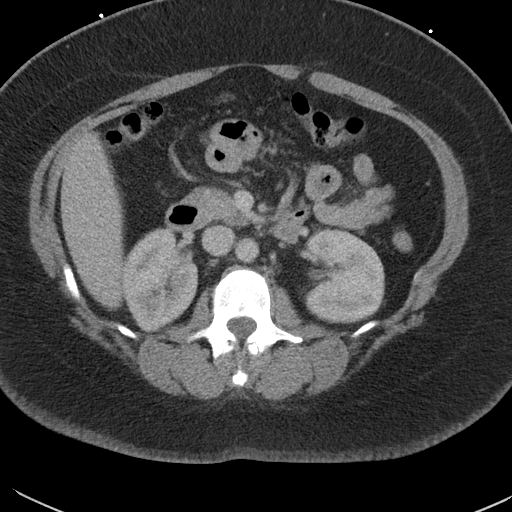
[im 58/87  bone]
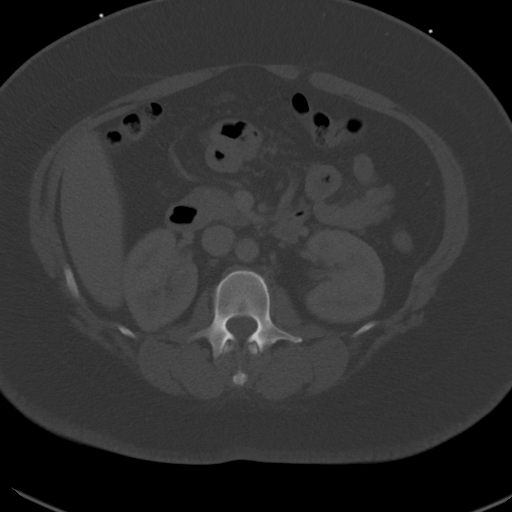
[im 63/87  soft-tissue]
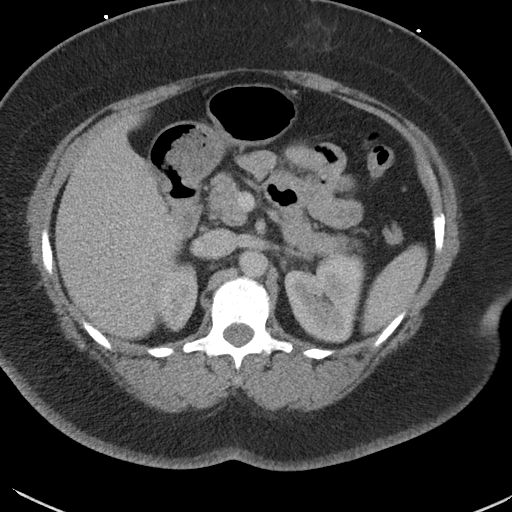
[im 67/87  soft-tissue]
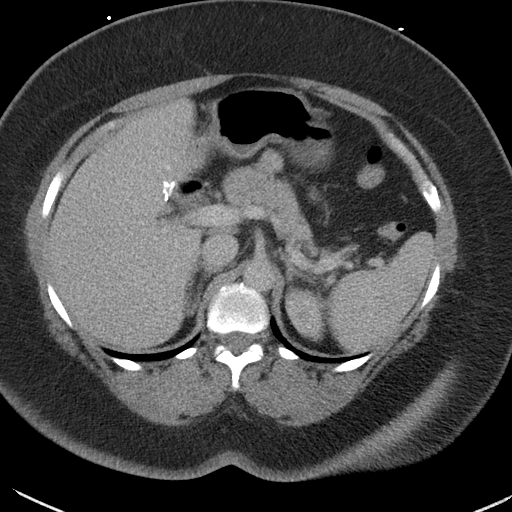
[im 77/87  soft-tissue]
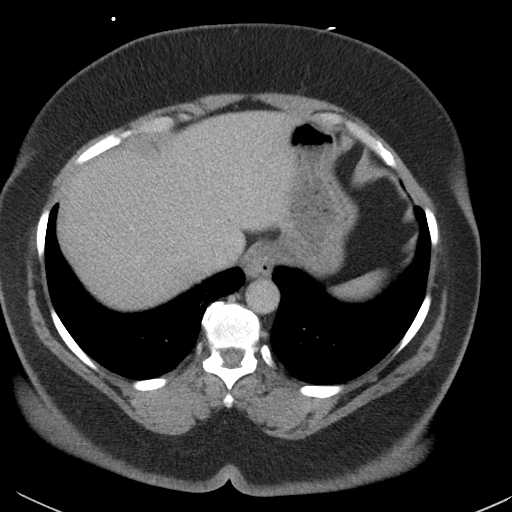
[im 82/87  soft-tissue]
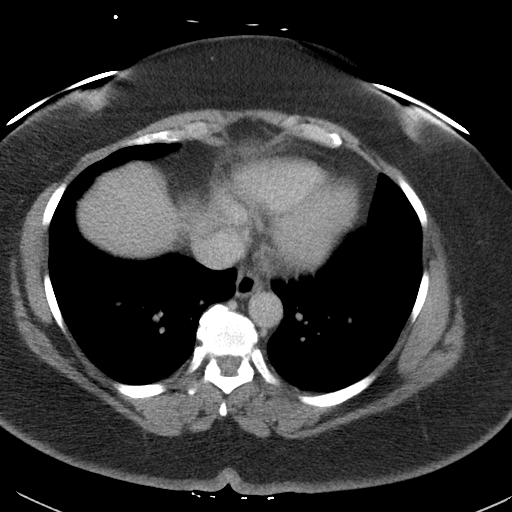

[Series 6: a/p w/ cor · coronal · 0.66mm/px · 3 of 169 slices shown]
[im 57/169  soft-tissue]
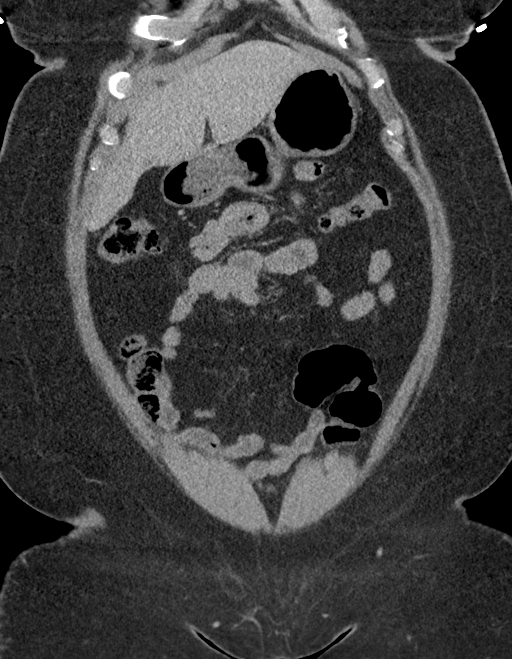
[im 75/169  soft-tissue]
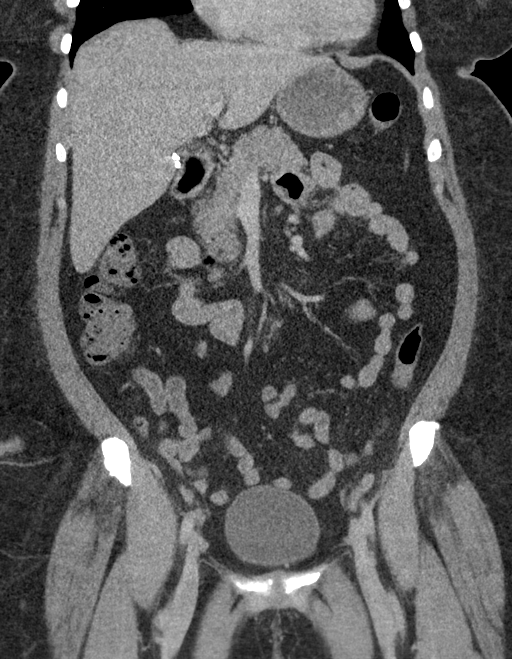
[im 94/169  soft-tissue]
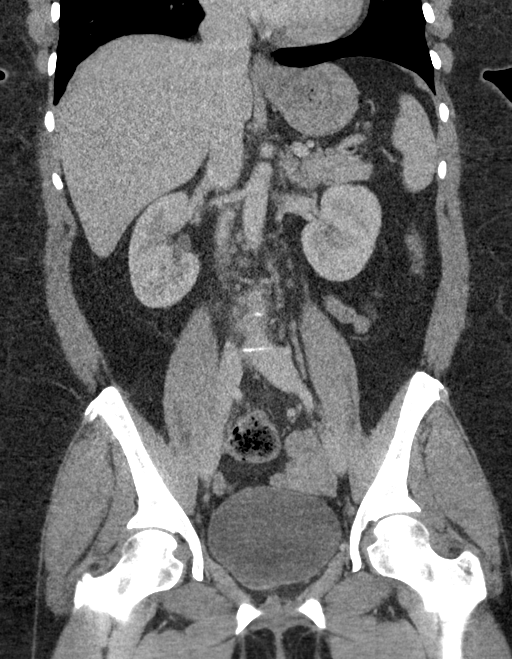

[16 of 46 positions shown; findings below may reference images not displayed]

FINDINGS: Lower chest: Minimal right basilar atelectasis or scarring.

Hepatobiliary: No focal liver abnormality is seen. Status post
cholecystectomy. No biliary dilatation.

Pancreas: Unremarkable. No pancreatic ductal dilatation or
surrounding inflammatory changes.

Spleen: Normal in size without focal abnormality.

Adrenals/Urinary Tract: Adrenal glands are unremarkable. Kidneys are
normal, without renal calculi, focal lesion, or hydronephrosis.
Bladder is unremarkable.

Stomach/Bowel: Stomach is within normal limits. Appendix appears
normal. No evidence of bowel wall thickening, distention, or
inflammatory changes.

Vascular/Lymphatic: No significant vascular findings are present. No
enlarged abdominal or pelvic lymph nodes.

Reproductive: Uterus and bilateral adnexa are unremarkable.

Other: Post umbilical hernia repair scarring.

Musculoskeletal: Bilateral iliac bone sclerosis compatible with
osteitis condensans ilii.
IMPRESSION: No acute abnormality.

## 2021-09-23 DIAGNOSIS — U071 COVID-19: Secondary | ICD-10-CM | POA: Diagnosis not present

## 2021-09-23 DIAGNOSIS — Z20822 Contact with and (suspected) exposure to covid-19: Secondary | ICD-10-CM | POA: Diagnosis not present

## 2021-10-09 DIAGNOSIS — R0789 Other chest pain: Secondary | ICD-10-CM | POA: Diagnosis not present

## 2021-10-09 DIAGNOSIS — A059 Bacterial foodborne intoxication, unspecified: Secondary | ICD-10-CM | POA: Diagnosis not present

## 2021-10-09 DIAGNOSIS — R1013 Epigastric pain: Secondary | ICD-10-CM | POA: Diagnosis not present

## 2021-10-09 DIAGNOSIS — Z13220 Encounter for screening for lipoid disorders: Secondary | ICD-10-CM | POA: Diagnosis not present

## 2021-10-09 DIAGNOSIS — R030 Elevated blood-pressure reading, without diagnosis of hypertension: Secondary | ICD-10-CM | POA: Diagnosis not present

## 2022-01-13 DIAGNOSIS — Z1231 Encounter for screening mammogram for malignant neoplasm of breast: Secondary | ICD-10-CM | POA: Diagnosis not present

## 2022-01-13 DIAGNOSIS — Z113 Encounter for screening for infections with a predominantly sexual mode of transmission: Secondary | ICD-10-CM | POA: Diagnosis not present

## 2022-01-13 DIAGNOSIS — Z01419 Encounter for gynecological examination (general) (routine) without abnormal findings: Secondary | ICD-10-CM | POA: Diagnosis not present

## 2022-01-13 DIAGNOSIS — Z6841 Body Mass Index (BMI) 40.0 and over, adult: Secondary | ICD-10-CM | POA: Diagnosis not present

## 2022-01-13 DIAGNOSIS — Z124 Encounter for screening for malignant neoplasm of cervix: Secondary | ICD-10-CM | POA: Diagnosis not present

## 2022-01-13 DIAGNOSIS — Z1151 Encounter for screening for human papillomavirus (HPV): Secondary | ICD-10-CM | POA: Diagnosis not present

## 2022-01-13 DIAGNOSIS — Z13820 Encounter for screening for osteoporosis: Secondary | ICD-10-CM | POA: Diagnosis not present

## 2023-01-08 DIAGNOSIS — U071 COVID-19: Secondary | ICD-10-CM | POA: Diagnosis not present

## 2023-02-25 DIAGNOSIS — Z131 Encounter for screening for diabetes mellitus: Secondary | ICD-10-CM | POA: Diagnosis not present

## 2023-02-25 DIAGNOSIS — Z01419 Encounter for gynecological examination (general) (routine) without abnormal findings: Secondary | ICD-10-CM | POA: Diagnosis not present

## 2023-02-25 DIAGNOSIS — Z1231 Encounter for screening mammogram for malignant neoplasm of breast: Secondary | ICD-10-CM | POA: Diagnosis not present

## 2023-02-25 DIAGNOSIS — Z13228 Encounter for screening for other metabolic disorders: Secondary | ICD-10-CM | POA: Diagnosis not present

## 2023-02-25 DIAGNOSIS — Z6841 Body Mass Index (BMI) 40.0 and over, adult: Secondary | ICD-10-CM | POA: Diagnosis not present

## 2023-02-25 DIAGNOSIS — Z1321 Encounter for screening for nutritional disorder: Secondary | ICD-10-CM | POA: Diagnosis not present

## 2023-02-25 DIAGNOSIS — Z113 Encounter for screening for infections with a predominantly sexual mode of transmission: Secondary | ICD-10-CM | POA: Diagnosis not present

## 2023-02-25 DIAGNOSIS — Z13220 Encounter for screening for lipoid disorders: Secondary | ICD-10-CM | POA: Diagnosis not present

## 2023-02-25 DIAGNOSIS — R319 Hematuria, unspecified: Secondary | ICD-10-CM | POA: Diagnosis not present

## 2023-02-25 DIAGNOSIS — Z1329 Encounter for screening for other suspected endocrine disorder: Secondary | ICD-10-CM | POA: Diagnosis not present

## 2023-02-26 ENCOUNTER — Other Ambulatory Visit: Payer: Self-pay | Admitting: Obstetrics and Gynecology

## 2023-02-26 DIAGNOSIS — Z720 Tobacco use: Secondary | ICD-10-CM

## 2023-03-16 DIAGNOSIS — F17210 Nicotine dependence, cigarettes, uncomplicated: Secondary | ICD-10-CM | POA: Diagnosis not present

## 2023-03-16 DIAGNOSIS — E1165 Type 2 diabetes mellitus with hyperglycemia: Secondary | ICD-10-CM | POA: Diagnosis not present

## 2023-03-16 DIAGNOSIS — E782 Mixed hyperlipidemia: Secondary | ICD-10-CM | POA: Diagnosis not present

## 2023-03-19 ENCOUNTER — Ambulatory Visit
Admission: RE | Admit: 2023-03-19 | Discharge: 2023-03-19 | Disposition: A | Discharge status: Home / Self Care | Payer: BC Managed Care – PPO | Source: Ambulatory Visit | Attending: Obstetrics and Gynecology | Admitting: Obstetrics and Gynecology

## 2023-03-19 DIAGNOSIS — Z720 Tobacco use: Secondary | ICD-10-CM

## 2023-03-19 DIAGNOSIS — F17210 Nicotine dependence, cigarettes, uncomplicated: Secondary | ICD-10-CM | POA: Diagnosis not present

## 2023-03-24 ENCOUNTER — Ambulatory Visit (AMBULATORY_SURGERY_CENTER): Payer: BC Managed Care – PPO

## 2023-03-24 ENCOUNTER — Encounter: Payer: Self-pay | Admitting: Internal Medicine

## 2023-03-24 ENCOUNTER — Telehealth: Payer: Self-pay

## 2023-03-24 VITALS — Ht 62.0 in | Wt 244.0 lb

## 2023-03-24 DIAGNOSIS — Z1211 Encounter for screening for malignant neoplasm of colon: Secondary | ICD-10-CM

## 2023-03-24 MED ORDER — NA SULFATE-K SULFATE-MG SULF 17.5-3.13-1.6 GM/177ML PO SOLN: 1.0000 | Freq: Once | ORAL | 0 refills | Status: AC

## 2023-03-24 NOTE — Progress Notes (Signed)

## 2023-03-24 NOTE — Telephone Encounter (Signed)
Telephone call to patient for her pre-visit appointment.  VM obtained and message left.  PV completed.

## 2023-03-24 NOTE — Progress Notes (Signed)
Previsit completed.  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  Work excuse provided.

## 2023-04-02 ENCOUNTER — Ambulatory Visit (AMBULATORY_SURGERY_CENTER): Payer: BC Managed Care – PPO | Admitting: Internal Medicine

## 2023-04-02 ENCOUNTER — Encounter: Payer: Self-pay | Admitting: Internal Medicine

## 2023-04-02 VITALS — BP 100/60 | HR 63 | Temp 97.3°F | Resp 24 | Ht 62.0 in | Wt 244.0 lb

## 2023-04-02 DIAGNOSIS — Z1211 Encounter for screening for malignant neoplasm of colon: Secondary | ICD-10-CM | POA: Diagnosis not present

## 2023-04-02 MED ORDER — SODIUM CHLORIDE 0.9 % IV SOLN: 500.0000 mL | INTRAVENOUS | Status: AC

## 2023-04-02 NOTE — Patient Instructions (Signed)
Resume previous diet.  Continue present medications.     YOU HAD AN ENDOSCOPIC PROCEDURE TODAY AT THE [facility]:   Refer to the procedure report that was given to you for any specific questions about what was found during the examination.  If the procedure report does not answer your questions, please call your gastroenterologist to clarify.  If you requested that your care partner not be given the details of your procedure findings, then the procedure report has been included in a sealed envelope for you to review at your convenience later.  YOU SHOULD EXPECT: Some feelings of bloating in the abdomen. Passage of more gas than usual.  Walking can help get rid of the air that was put into your GI tract during the procedure and reduce the bloating. If you had a lower endoscopy (such as a colonoscopy or flexible sigmoidoscopy) you may notice spotting of blood in your stool or on the toilet paper. If you underwent a bowel prep for your procedure, you may not have a normal bowel movement for a few days.  Please Note:  You might notice some irritation and congestion in your nose or some drainage.  This is from the oxygen used during your procedure.  There is no need for concern and it should clear up in a day or so.  SYMPTOMS TO REPORT IMMEDIATELY:  Following lower endoscopy (colonoscopy or flexible sigmoidoscopy):  Excessive amounts of blood in the stool  Significant tenderness or worsening of abdominal pains  Swelling of the abdomen that is new, acute  Fever of 100F or higher   For urgent or emergent issues, a gastroenterologist can be reached at any hour by calling [phone]. Do not use MyChart messaging for urgent concerns.    DIET:  We do recommend a small meal at first, but then you may proceed to your regular diet.  Drink plenty of fluids but you should avoid alcoholic beverages for 24 hours.  ACTIVITY:  You should plan to take it easy for the rest of today and you  should NOT DRIVE or use heavy machinery until tomorrow (because of the sedation medicines used during the test).    FOLLOW UP: Our staff will call the number listed on your records the next business day following your procedure.  We will call around 7:15- 8:00 am to check on you and address any questions or concerns that you may have regarding the information given to you following your procedure. If we do not reach you, we will leave a message.     If any biopsies were taken you will be contacted by phone or by letter within the next 1-3 weeks.  Please call us at [phone] if you have not heard about the biopsies in 3 weeks.    SIGNATURES/CONFIDENTIALITY: You and/or your care partner have signed paperwork which will be entered into your electronic medical record.  These signatures attest to the fact that that the information above on your After Visit Summary has been reviewed and is understood.  Full responsibility of the confidentiality of this discharge information lies with you and/or your care-partner.

## 2023-04-02 NOTE — Progress Notes (Unsigned)
Sedate, gd SR, tolerated procedure well, VSS, report to RN 

## 2023-04-02 NOTE — Progress Notes (Unsigned)
Vital signs prior to 1615 not associated with this patient

## 2023-04-02 NOTE — Progress Notes (Signed)
Pt's states no medical or surgical changes since previsit or office visit. 

## 2023-04-02 NOTE — Progress Notes (Unsigned)
HISTORY OF PRESENT ILLNESS:  Katherine Bean is a 57 y.o. female who presents today for routine screening colonoscopy.  No complaints  REVIEW OF SYSTEMS:  All non-GI ROS negative except for  History reviewed. No pertinent past medical history.  Past Surgical History:  Procedure Laterality Date   CHOLECYSTECTOMY     Laparoscopic   HERNIA REPAIR     19years ago  with mesh   ROBOTIC ASSISTED SALPINGO OOPHERECTOMY Right 11/13/2014   Procedure: ROBOTIC ASSISTED RIGHT SALPINGO OOPHORECTOMY LYSIS OF ADHESIONS;  Surgeon: Adolphus Birchwood, MD;  Location: [facility];  Service: Gynecology;  Laterality: Right;   TUBAL LIGATION      Social History Katherine Bean  reports that she has been smoking cigarettes. She has a 5 pack-year smoking history. She has never used smokeless tobacco. She reports current alcohol use of about 1.0 standard drink of alcohol per week. She reports that she does not use drugs.  family history includes Healthy in her father; Kidney failure in her mother.  Allergies  Allergen Reactions   Shellfish Allergy Anaphylaxis    Swelling ,breathing problems.   Metronidazole Rash    When using the gel    Oxycodone Hcl Rash    "Percocet" rash, itching       PHYSICAL EXAMINATION: Vital signs: BP (!) 154/86   Pulse 88   Temp (!) 97.3 F (36.3 C)   Resp (!) 21   Ht 5\' 2"  (1.575 m)   Wt 244 lb (110.7 kg)   LMP 12/13/2014   SpO2 98%   BMI 44.63 kg/m  General: Well-developed, well-nourished, no acute distress HEENT: Sclerae are anicteric, conjunctiva pink. Oral mucosa intact Lungs: Clear Heart: Regular Abdomen: soft, nontender, nondistended, no obvious ascites, no peritoneal signs, normal bowel sounds. No organomegaly. Extremities: No edema Psychiatric: alert and oriented x3. Cooperative     ASSESSMENT:  Colon cancer screening   PLAN:   Screening colonoscopy

## 2023-04-02 NOTE — Op Note (Signed)
[facility] Patient Name: Katherine Bean Procedure Date: 04/02/2023 4:20 PM MRN: [id number] Endoscopist: Wilhemina Bonito. Marina Goodell , MD, [id number] Age: 56 Referring MD:  Date of Birth: Apr 05, 1966 Gender: Female Account #: [id number] Procedure:                Colonoscopy Indications:              Screening for colorectal malignant neoplasm Medicines:                Monitored Anesthesia Care Procedure:                Pre-Anesthesia Assessment:                           - Prior to the procedure, a History and Physical                            was performed, and patient medications and                            allergies were reviewed. The patient's tolerance of                            previous anesthesia was also reviewed. The risks                            and benefits of the procedure and the sedation                            options and risks were discussed with the patient.                            All questions were answered, and informed consent                            was obtained. Prior Anticoagulants: The patient has                            taken no anticoagulant or antiplatelet agents. ASA                            Grade Assessment: II - A patient with mild systemic                            disease. After reviewing the risks and benefits,                            the patient was deemed in satisfactory condition to                            undergo the procedure.                           After obtaining informed consent, the colonoscope  was passed under direct vision. Throughout the                            procedure, the patient's blood pressure, pulse, and                            oxygen saturations were monitored continuously. The                            CF [id number] #[id number] was introduced through the anus                            and advanced to the the cecum, identified by                            appendiceal orifice  and ileocecal valve. The                            ileocecal valve, appendiceal orifice, and rectum                            were photographed. The quality of the bowel                            preparation was excellent. The colonoscopy was                            performed without difficulty. The patient tolerated                            the procedure well. The bowel preparation used was                            SUPREP via split dose instruction. Scope In: 4:25:39 PM Scope Out: 4:34:27 PM Scope Withdrawal Time: 0 hours 6 minutes 35 seconds  Total Procedure Duration: 0 hours 8 minutes 48 seconds  Findings:                 Multiple diverticula were found in the right colon.                            Internal hemorrhoids.                           The exam was otherwise without abnormality on                            direct and retroflexion views. Complications:            No immediate complications. Estimated blood loss:                            None. Estimated Blood Loss:     Estimated blood loss: none. Impression:               - Diverticulosis in the  right colon.                           - The examination was otherwise normal on direct                            and retroflexion views.                           - No specimens collected. Recommendation:           - Repeat colonoscopy in 10 years for screening                            purposes.                           - Patient has a contact number available for                            emergencies. The signs and symptoms of potential                            delayed complications were discussed with the                            patient. Return to normal activities tomorrow.                            Written discharge instructions were provided to the                            patient.                           - Resume previous diet.                           - Continue present medications. Wilhemina Bonito.  Marina Goodell, MD 04/02/2023 4:39:29 PM This report has been signed electronically.

## 2023-04-05 ENCOUNTER — Telehealth: Payer: Self-pay

## 2023-04-05 NOTE — Telephone Encounter (Signed)
  Follow up Call-     04/02/2023    3:36 PM  Call back number  Post procedure Call Back phone  # [phone]  Permission to leave phone message Yes     Patient questions:  Do you have a fever, pain , or abdominal swelling? No. Pain Score  0 *  Have you tolerated food without any problems? Yes.    Have you been able to return to your normal activities? Yes.    Do you have any questions about your discharge instructions: Diet   No. Medications  No. Follow up visit  No.  Do you have questions or concerns about your Care? No.  Actions: * If pain score is 4 or above: No action needed, pain <4.

## 2023-04-12 DIAGNOSIS — R319 Hematuria, unspecified: Secondary | ICD-10-CM | POA: Diagnosis not present

## 2023-05-04 DIAGNOSIS — E559 Vitamin D deficiency, unspecified: Secondary | ICD-10-CM | POA: Diagnosis not present

## 2023-05-04 DIAGNOSIS — Z0000 Encounter for general adult medical examination without abnormal findings: Secondary | ICD-10-CM | POA: Diagnosis not present

## 2023-05-04 DIAGNOSIS — Z1389 Encounter for screening for other disorder: Secondary | ICD-10-CM | POA: Diagnosis not present

## 2023-05-04 DIAGNOSIS — E782 Mixed hyperlipidemia: Secondary | ICD-10-CM | POA: Diagnosis not present

## 2023-05-04 DIAGNOSIS — E1165 Type 2 diabetes mellitus with hyperglycemia: Secondary | ICD-10-CM | POA: Diagnosis not present

## 2024-03-14 ENCOUNTER — Other Ambulatory Visit: Payer: Self-pay | Admitting: Obstetrics and Gynecology

## 2024-03-14 ENCOUNTER — Encounter: Payer: Self-pay | Admitting: Obstetrics and Gynecology

## 2024-03-14 DIAGNOSIS — F17210 Nicotine dependence, cigarettes, uncomplicated: Secondary | ICD-10-CM

## 2024-03-14 DIAGNOSIS — I708 Atherosclerosis of other arteries: Secondary | ICD-10-CM
